# Patient Record
Sex: Male | Born: 1995 | Race: White | Hispanic: No | Marital: Single | Smoking: Current every day smoker
Health system: Southern US, Community
[De-identification: ages and names within clinical notes are randomized; demographics above are authoritative.]

---
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
## 2015-02-12 ENCOUNTER — Emergency Department
Admission: EM | Admit: 2015-02-12 | Discharge: 2015-02-12 | Disposition: A | Discharge status: Home / Self Care | Payer: Self-pay | Attending: Emergency Medicine | Admitting: Emergency Medicine

## 2015-02-12 ENCOUNTER — Encounter: Payer: Self-pay | Admitting: Emergency Medicine

## 2015-02-12 DIAGNOSIS — Z87891 Personal history of nicotine dependence: Secondary | ICD-10-CM | POA: Insufficient documentation

## 2015-02-12 DIAGNOSIS — Z79899 Other long term (current) drug therapy: Secondary | ICD-10-CM | POA: Insufficient documentation

## 2015-02-12 DIAGNOSIS — R300 Dysuria: Secondary | ICD-10-CM | POA: Insufficient documentation

## 2015-02-12 LAB — URINALYSIS COMPLETE WITH MICROSCOPIC (ARMC ONLY)
BACTERIA UA: NONE SEEN
BILIRUBIN URINE: NEGATIVE
Glucose, UA: NEGATIVE mg/dL
Hgb urine dipstick: NEGATIVE
KETONES UR: NEGATIVE mg/dL
Nitrite: NEGATIVE
Protein, ur: 30 mg/dL — AB
Specific Gravity, Urine: 1.029 (ref 1.005–1.030)
pH: 6 (ref 5.0–8.0)

## 2015-02-12 LAB — CHLAMYDIA/NGC RT PCR (ARMC ONLY)
Chlamydia Tr: DETECTED — AB
N gonorrhoeae: NOT DETECTED

## 2015-02-12 MED ORDER — LIDOCAINE HCL (PF) 1 % IJ SOLN
INTRAMUSCULAR | Status: AC
  Filled 2015-02-12: qty 5

## 2015-02-12 MED ORDER — PHENAZOPYRIDINE HCL 200 MG PO TABS: 200.0000 mg | ORAL_TABLET | Freq: Three times a day (TID) | ORAL | Status: DC | PRN

## 2015-02-12 MED ORDER — AZITHROMYCIN 250 MG PO TABS
1000.0000 mg | ORAL_TABLET | Freq: Once | ORAL | Status: AC
  Administered 2015-02-12: 1000 mg via ORAL

## 2015-02-12 MED ORDER — METRONIDAZOLE 500 MG PO TABS: 2000.0000 mg | ORAL_TABLET | Freq: Once | ORAL | Status: AC

## 2015-02-12 MED ORDER — CEFTRIAXONE SODIUM 250 MG IJ SOLR
250.0000 mg | Freq: Once | INTRAMUSCULAR | Status: AC
Start: 2015-02-12 — End: 2015-02-12
  Administered 2015-02-12: 250 mg via INTRAMUSCULAR

## 2015-02-12 MED ORDER — PHENAZOPYRIDINE HCL 200 MG PO TABS
ORAL_TABLET | ORAL | Status: AC
  Administered 2015-02-12: 200 mg via ORAL
  Filled 2015-02-12: qty 1

## 2015-02-12 MED ORDER — CEFTRIAXONE SODIUM 250 MG IJ SOLR
INTRAMUSCULAR | Status: AC
  Administered 2015-02-12: 250 mg via INTRAMUSCULAR
  Filled 2015-02-12: qty 250

## 2015-02-12 MED ORDER — PHENAZOPYRIDINE HCL 200 MG PO TABS
200.0000 mg | ORAL_TABLET | Freq: Once | ORAL | Status: AC
  Administered 2015-02-12: 200 mg via ORAL

## 2015-02-12 MED ORDER — IBUPROFEN 800 MG PO TABS: 800.0000 mg | ORAL_TABLET | Freq: Three times a day (TID) | ORAL | Status: DC | PRN

## 2015-02-12 MED ORDER — AZITHROMYCIN 250 MG PO TABS
ORAL_TABLET | ORAL | Status: AC
  Administered 2015-02-12: 1000 mg via ORAL
  Filled 2015-02-12: qty 4

## 2015-02-12 NOTE — ED Provider Notes (Signed)
[facility] Emergency Department Provider Note  ____________________________________________  Time seen: 1143   I have reviewed the triage vital signs and the nursing notes.   HISTORY  Chief Complaint Dysuria    HPI Jeffrey Kirk is a 19 y.o. male comes here today with a couple weeks of dysuria and states she was checked 3 weeks with health department and was told it was negative but he has continued to have symptoms states that his partner has not been checked with they have not been sexually active for about 2 weeks now and symptoms have persisted denies any testicular pain or tenderness nausea vomiting bleeding has noticed a white discharge and pain with urination. Today for further evaluation and treatment   No past medical history on file.  There are no active problems to display for this patient.   No past surgical history on file.  Current Outpatient Rx  Name  Route  Sig  Dispense  Refill  . ibuprofen (ADVIL,MOTRIN) 800 MG tablet   Oral   Take 1 tablet (800 mg total) by mouth every 8 (eight) hours as needed.   30 tablet   0   . metroNIDAZOLE (FLAGYL) 500 MG tablet   Oral   Take 4 tablets (2,000 mg total) by mouth once.   4 tablet   0   . phenazopyridine (PYRIDIUM) 200 MG tablet   Oral   Take 1 tablet (200 mg total) by mouth 3 (three) times daily as needed for pain.   10 tablet   0     Allergies Review of patient's allergies indicates no known allergies.  No family history on file.  Social History History  Substance Use Topics  . Smoking status: Former Games developer  . Smokeless tobacco: Not on file  . Alcohol Use: Yes    Review of Systems Constitutional: No fever/chills Eyes: No visual changes. ENT: No sore throat. Cardiovascular: Denies chest pain. Respiratory: Denies shortness of breath. Gastrointestinal: No abdominal pain.  No nausea, no vomiting.  No diarrhea.  No constipation. Genitourinary: dysuria. Musculoskeletal:  Negative for back pain. Skin: Negative for rash. Neurological: Negative for headaches, focal weakness or numbness.  10-point ROS otherwise negative.  ____________________________________________   PHYSICAL EXAM:  VITAL SIGNS: ED Triage Vitals  Enc Vitals Group     BP 02/12/15 1035 106/83 mmHg     Pulse Rate 02/12/15 1035 64     Resp 02/12/15 1035 18     Temp 02/12/15 1035 97.7 F (36.5 C)     Temp Source 02/12/15 1035 Oral     SpO2 02/12/15 1035 100 %     Weight 02/12/15 1035 135 lb (61.236 kg)     Height 02/12/15 1035  (1.778 m)     Head Cir --      Peak Flow --      Pain Score 02/12/15 1035 0     Pain Loc --      Pain Edu? --      Excl. in GC? --     Constitutional: Alert and oriented. Well appearing and in no acute distress. Eyes: Conjunctivae are normal. PERRL. EOMI. Head: Atraumatic. Nose: No congestion/rhinnorhea. Mouth/Throat: Mucous membranes are moist.  Oropharynx non-erythematous. Neck: No stridor.   Cardiovascular: Normal rate, regular rhythm. Grossly normal heart sounds.  Good peripheral circulation. Respiratory: Normal respiratory effort.  No retractions. Lungs CTAB. Gastrointestinal: Soft and nontender. No distention. No abdominal bruits. No CVA tenderness. Genitourinary: normal genitalia normal testicular exam there is some  discharge on the inside of his boxers but otherwise normal exam Musculoskeletal: No lower extremity tenderness nor edema.  No joint effusions. Neurologic:  Normal speech and language. No gross focal neurologic deficits are appreciated. Speech is normal. No gait instability. Skin:  Skin is warm, dry and intact. No rash noted. Psychiatric: Mood and affect are normal. Speech and behavior are normal.  ____________________________________________   LABS (all labs ordered are listed, but only abnormal results are displayed)  Labs Reviewed  URINALYSIS COMPLETEWITH MICROSCOPIC ([facility] ONLY) - Abnormal; Notable for the following:     Color, Urine YELLOW (*)    APPearance CLEAR (*)    Protein, ur 30 (*)    Leukocytes, UA TRACE (*)    Squamous Epithelial / LPF 0-5 (*)    All other components within normal limits  CHLAMYDIA/NGC RT PCR ([facility] ONLY)     PROCEDURES  Procedure(s) performed: None  Critical Care performed: No  ____________________________________________   INITIAL IMPRESSION / ASSESSMENT AND PLAN / ED COURSE  Pertinent labs & imaging results that were available during my care of the patient were reviewed by me and considered in my medical decision making (see chart for details).  Initial impression on this patient's urethritis dysuria based on the patient's presentation and exam the concern is that he has a form of sexually transmitted disease go ahead and cover him for gonorrhea Chlamydia and Trichomonas labs and cultures of been sent recommend that he follow up with the health department on the department he received Rocephin and azithromycin got a prescription for Flagyl Pyridium and Motrin return here for any acute concerns or worsening symptoms he is to have his partners checked and treated also ____________________________________________   FINAL CLINICAL IMPRESSION(S) / ED DIAGNOSES  Final diagnoses:  Dysuria      Lavon Bothwell Rosalyn Gess, PA-C 02/12/15 1300  Emily Filbert, MD 02/12/15 1428

## 2015-02-12 NOTE — ED Notes (Signed)
C/o burning upon urination x 1 week, denies any n,v, also c/o white discharge coming from penis

## 2015-02-12 NOTE — Discharge Instructions (Signed)

## 2015-02-12 NOTE — ED Notes (Signed)
Pt states dysuria, pt states white discharge, pt states he was checked several weeks ago for STD and was told he was negative, pt is sexually active

## 2015-03-11 ENCOUNTER — Emergency Department
Admission: EM | Admit: 2015-03-11 | Discharge: 2015-03-12 | Disposition: A | Discharge status: Home / Self Care | Payer: Self-pay | Attending: Emergency Medicine | Admitting: Emergency Medicine

## 2015-03-11 ENCOUNTER — Encounter: Payer: Self-pay | Admitting: Urgent Care

## 2015-03-11 DIAGNOSIS — Z720 Tobacco use: Secondary | ICD-10-CM | POA: Insufficient documentation

## 2015-03-11 DIAGNOSIS — A64 Unspecified sexually transmitted disease: Secondary | ICD-10-CM

## 2015-03-11 DIAGNOSIS — A749 Chlamydial infection, unspecified: Secondary | ICD-10-CM | POA: Insufficient documentation

## 2015-03-11 DIAGNOSIS — Z79899 Other long term (current) drug therapy: Secondary | ICD-10-CM | POA: Insufficient documentation

## 2015-03-11 LAB — URINALYSIS COMPLETE WITH MICROSCOPIC (ARMC ONLY)
Bilirubin Urine: NEGATIVE
Glucose, UA: NEGATIVE mg/dL
Hgb urine dipstick: NEGATIVE
Ketones, ur: NEGATIVE mg/dL
Leukocytes, UA: NEGATIVE
Nitrite: NEGATIVE
PROTEIN: 30 mg/dL — AB
RBC / HPF: NONE SEEN RBC/hpf (ref 0–5)
SPECIFIC GRAVITY, URINE: 1.026 (ref 1.005–1.030)
WBC UA: NONE SEEN WBC/hpf (ref 0–5)
pH: 6 (ref 5.0–8.0)

## 2015-03-11 NOTE — ED Notes (Signed)
Patient presents with c/o lower back pain and c/o dysuria for the last few days. Denies fever, gross hematuria, and penile discharge.

## 2015-03-12 MED ORDER — AZITHROMYCIN 1 G PO PACK
PACK | ORAL | Status: AC
  Administered 2015-03-12: 1 g via ORAL
  Filled 2015-03-12: qty 1

## 2015-03-12 MED ORDER — CEFTRIAXONE SODIUM 250 MG IJ SOLR
INTRAMUSCULAR | Status: AC
Start: 2015-03-12 — End: 2015-03-12
  Administered 2015-03-12: 250 mg via INTRAMUSCULAR
  Filled 2015-03-12: qty 250

## 2015-03-12 MED ORDER — AZITHROMYCIN 1 G PO PACK
1.0000 g | PACK | Freq: Once | ORAL | Status: AC
  Administered 2015-03-12: 1 g via ORAL

## 2015-03-12 MED ORDER — CEFTRIAXONE SODIUM 250 MG IJ SOLR
250.0000 mg | Freq: Once | INTRAMUSCULAR | Status: AC
Start: 2015-03-12 — End: 2015-03-12
  Administered 2015-03-12: 250 mg via INTRAMUSCULAR

## 2015-03-12 NOTE — ED Notes (Signed)
Pt reports dysuria for 2 weeks.  Denies penile discharge.  Pt reports right lower back pain  No n/v/d.

## 2015-03-12 NOTE — ED Provider Notes (Signed)
[facility] Emergency Department Provider Note  ____________________________________________  Time seen: 12:05 AM  I have reviewed the triage vital signs and the nursing notes.   HISTORY  Chief Complaint Dysuria      HPI Jeffrey Kirk is a 19 y.o. male presents with dysuria 2 weeks. Patient denies penile discharge at this time. Of note patient was seen on 02/12/2015 at [facility] ED for dysuria patient was given antibiotics at that time. Urine culture revealed chlamydia positive. Patient states that he was unaware of what he was being treated for and was not aware of the urine results. As such patient is indeed sexually active and has continued to be sexually active with the same partner who was never treated nor tested.     Past medical history Chlamydia  Past surgical history None    Current Outpatient Rx  Name  Route  Sig  Dispense  Refill  . ibuprofen (ADVIL,MOTRIN) 800 MG tablet   Oral   Take 1 tablet (800 mg total) by mouth every 8 (eight) hours as needed.   30 tablet   0   . phenazopyridine (PYRIDIUM) 200 MG tablet   Oral   Take 1 tablet (200 mg total) by mouth 3 (three) times daily as needed for pain.   10 tablet   0     Allergies Review of patient's allergies indicates no known allergies.  No family history on file.  Social History History  Substance Use Topics  . Smoking status: Current Every Day Smoker  . Smokeless tobacco: Not on file  . Alcohol Use: Yes    Review of Systems  Constitutional: Negative for fever. Eyes: Negative for visual changes. ENT: Negative for sore throat. Cardiovascular: Negative for chest pain. Respiratory: Negative for shortness of breath. Gastrointestinal: Negative for abdominal pain, vomiting and diarrhea. Genitourinary: Negative for dysuria. Musculoskeletal: Negative for back pain. Skin: Negative for rash. Neurological: Negative for headaches, focal weakness or numbness.   10-point ROS  otherwise negative.  ____________________________________________   PHYSICAL EXAM:  VITAL SIGNS: ED Triage Vitals  Enc Vitals Group     BP 03/11/15 2239 123/88 mmHg     Pulse Rate 03/11/15 2239 87     Resp 03/11/15 2239 18     Temp 03/11/15 2239 98.2 F (36.8 C)     Temp Source 03/11/15 2239 Oral     SpO2 03/11/15 2239 98 %     Weight 03/11/15 2239 135 lb (61.236 kg)     Height 03/11/15 2239  (1.778 m)     Head Cir --      Peak Flow --      Pain Score 03/11/15 2240 4     Pain Loc --      Pain Edu? --      Excl. in GC? --      Constitutional: Alert and oriented. Well appearing and in no distress. Eyes: Conjunctivae are normal. PERRL. Normal extraocular movements. ENT   Head: Normocephalic and atraumatic.   Nose: No congestion/rhinnorhea.   Mouth/Throat: Mucous membranes are moist.   Neck: No stridor. Cardiovascular: Normal rate, regular rhythm. Normal and symmetric distal pulses are present in all extremities. No murmurs, rubs, or gallops. Respiratory: Normal respiratory effort without tachypnea nor retractions. Breath sounds are clear and equal bilaterally. No wheezes/rales/rhonchi. Gastrointestinal: Soft and nontender. No distention. There is no CVA tenderness. Genitourinary: deferred Musculoskeletal: Nontender with normal range of motion in all extremities. No joint effusions.  No lower extremity tenderness nor  edema. Neurologic:  Normal speech and language. No gross focal neurologic deficits are appreciated. Speech is normal.  Skin:  Skin is warm, dry and intact. No rash noted. Psychiatric: Mood and affect are normal. Speech and behavior are normal. Patient exhibits appropriate insight and judgment.     INITIAL IMPRESSION / ASSESSMENT AND PLAN / ED COURSE  Pertinent labs & imaging results that were available during my care of the patient were reviewed by me and considered in my medical decision making (see chart for details). Strip physical exam  consistent with possible chlamydia such patient received azithromycin 1 g by mouth as well as ceftriaxone 250 mg intramuscular   ____________________________________________   FINAL CLINICAL IMPRESSION(S) / ED DIAGNOSES  Final diagnoses:  STD (male)  Chlamydia      Darci Current, MD 03/12/15 [id number]

## 2015-03-12 NOTE — Discharge Instructions (Signed)
Sexually Transmitted Disease °A sexually transmitted disease (STD) is a disease or infection that may be passed (transmitted) from person to person, usually during sexual activity. This may happen by way of saliva, semen, blood, vaginal mucus, or urine. Common STDs include:  °· Gonorrhea.   °· Chlamydia.   °· Syphilis.   °· HIV and AIDS.   °· Genital herpes.   °· Hepatitis B and C.   °· Trichomonas.   °· Human papillomavirus (HPV).   °· Pubic lice.   °· Scabies. °· Mites. °· Bacterial vaginosis. °WHAT ARE CAUSES OF STDs? °An STD may be caused by bacteria, a virus, or parasites. STDs are often transmitted during sexual activity if one person is infected. However, they may also be transmitted through nonsexual means. STDs may be transmitted after:  °· Sexual intercourse with an infected person.   °· Sharing sex toys with an infected person.   °· Sharing needles with an infected person or using unclean piercing or tattoo needles. °· Having intimate contact with the genitals, mouth, or rectal areas of an infected person.   °· Exposure to infected fluids during birth. °WHAT ARE THE SIGNS AND SYMPTOMS OF STDs? °Different STDs have different symptoms. Some people may not have any symptoms. If symptoms are present, they may include:  °· Painful or bloody urination.   °· Pain in the pelvis, abdomen, vagina, anus, throat, or eyes.   °· A skin rash, itching, or irritation. °· Growths, ulcerations, blisters, or sores in the genital and anal areas. °· Abnormal vaginal discharge with or without bad odor.   °· Penile discharge in men.   °· Fever.   °· Pain or bleeding during sexual intercourse.   °· Swollen glands in the groin area.   °· Yellow skin and eyes (jaundice). This is seen with hepatitis.   °· Swollen testicles. °· Infertility. °· Sores and blisters in the mouth. °HOW ARE STDs DIAGNOSED? °To make a diagnosis, your health care provider may:  °· Take a medical history.   °· Perform a physical exam.   °· Take a sample of  any discharge to examine. °· Swab the throat, cervix, opening to the penis, rectum, or vagina for testing. °· Test a sample of your first morning urine.   °· Perform blood tests.   °· Perform a Pap test, if this applies.   °· Perform a colposcopy.   °· Perform a laparoscopy.   °HOW ARE STDs TREATED? ° Treatment depends on the STD. Some STDs may be treated but not cured.  °· Chlamydia, gonorrhea, trichomonas, and syphilis can be cured with antibiotic medicine.   °· Genital herpes, hepatitis, and HIV can be treated, but not cured, with prescribed medicines. The medicines lessen symptoms.   °· Genital warts from HPV can be treated with medicine or by freezing, burning (electrocautery), or surgery. Warts may come back.   °· HPV cannot be cured with medicine or surgery. However, abnormal areas may be removed from the cervix, vagina, or vulva.   °· If your diagnosis is confirmed, your recent sexual partners need treatment. This is true even if they are symptom-free or have a negative culture or evaluation. They should not have sex until their health care providers say it is okay. °HOW CAN I REDUCE MY RISK OF GETTING AN STD? °Take these steps to reduce your risk of getting an STD: °· Use latex condoms, dental dams, and water-soluble lubricants during sexual activity. Do not use petroleum jelly or oils. °· Avoid having multiple sex partners. °· Do not have sex with someone who has other sex partners. °· Do not have sex with anyone you do not know or who is at   high risk for an STD.  Avoid risky sex practices that can break your skin.  Do not have sex if you have open sores on your mouth or skin.  Avoid drinking too much alcohol or taking illegal drugs. Alcohol and drugs can affect your judgment and put you in a vulnerable position.  Avoid engaging in oral and anal sex acts.  Get vaccinated for HPV and hepatitis. If you have not received these vaccines in the past, talk to your health care provider about whether one  or both might be right for you.   If you are at risk of being infected with HIV, it is recommended that you take a prescription medicine daily to prevent HIV infection. This is called pre-exposure prophylaxis (PrEP). You are considered at risk if:  You are a man who has sex with other men (MSM).  You are a heterosexual man or woman and are sexually active with more than one partner.  You take drugs by injection.  You are sexually active with a partner who has HIV.  Talk with your health care provider about whether you are at high risk of being infected with HIV. If you choose to begin PrEP, you should first be tested for HIV. You should then be tested every 3 months for as long as you are taking PrEP.  WHAT SHOULD I DO IF I THINK I HAVE AN STD?  See your health care provider.   Tell your sexual partner(s). They should be tested and treated for any STDs.  Do not have sex until your health care provider says it is okay. WHEN SHOULD I GET IMMEDIATE MEDICAL CARE? Contact your health care provider right away if:   You have severe abdominal pain.  You are a man and notice swelling or pain in your testicles.  You are a woman and notice swelling or pain in your vagina. Document Released: 10/29/2002 Document Revised: 08/13/2013 Document Reviewed: 02/26/2013 [facility] Patient Information 2015 [place], [place]. This information is not intended to replace advice given to you by your health care provider. Make sure you discuss any questions you have with your health care provider.  Chlamydia Chlamydia is an infection. It is spread through sexual contact. Chlamydia can be in different areas of the body. These areas include the urethra, throat, or rectum. It is important to treat chlamydia as soon as possible. It can damage other organs.  CAUSES  Chlamydia is caused by bacteria. It is a sexually transmitted disease. This means that it is passed from an infected partner during intimate contact.  This contact could be with the genitals, mouth, or rectal area.  SIGNS AND SYMPTOMS  There may not be any symptoms. This is often the case early in the infection. If there are symptoms, they are usually mild and may only be noticeable in the morning. Symptoms you may notice include:   Burning with urination.  Pain or swelling in the testicles.  Watery mucus-like discharge from the penis.  Long-standing (chronic) pelvic pain after frequent infections.  Pain, swelling, or itching around the anus.  A sore throat.  Itching, burning, or redness in the eyes, or discharge from the eyes. DIAGNOSIS  To diagnose this infection, your health care provider will do a pelvic exam. A sample of urine or a swab from the rectum may be taken for testing.  TREATMENT  Chlamydia is treated with antibiotic medicines.  HOME CARE INSTRUCTIONS  Take your antibiotic medicine as directed by your health care provider. [employer]  the antibiotic even if you start to feel better. Incomplete treatment will put you at risk for not being able to have children (sterility).   Take medicines only as directed by your health care provider.   Rest.   Inform any sexual partners about your infection. Even if they are symptom free or have a negative culture or evaluation, they should be treated for the condition.   Do not have sex (intercourse) until treatment is completed and your health care provider says it is okay.   Keep all follow-up visits as directed by your health care provider.   Not all test results are available during your visit. If your test results are not back during the visit, make an appointment with your health care provider to find out the results. Do not assume everything is normal if you have not heard from your health care provider or the medical facility. It is your responsibility to get your test results. SEEK MEDICAL CARE IF:  You develop new joint pain.  You have a fever. SEEK IMMEDIATE  MEDICAL CARE IF:   Your pain increases.   You have abnormal discharge.   You have pain during intercourse. MAKE SURE YOU:   Understand these instructions.  Will watch your condition.  Will get help right away if you are not doing well or get worse. Document Released: 08/08/2005 Document Revised: 12/23/2013 Document Reviewed: 02/14/2013 [facility] Patient Information 2015 [place], [place]. This information is not intended to replace advice given to you by your health care provider. Make sure you discuss any questions you have with your health care provider.

## 2015-04-03 ENCOUNTER — Emergency Department
Admission: EM | Admit: 2015-04-03 | Discharge: 2015-04-03 | Discharge status: Against Medical Advice | Payer: Self-pay | Attending: Emergency Medicine | Admitting: Emergency Medicine

## 2015-04-03 DIAGNOSIS — Z720 Tobacco use: Secondary | ICD-10-CM | POA: Insufficient documentation

## 2015-04-03 DIAGNOSIS — R1030 Lower abdominal pain, unspecified: Secondary | ICD-10-CM | POA: Insufficient documentation

## 2015-07-06 ENCOUNTER — Emergency Department: Payer: Self-pay

## 2015-07-06 ENCOUNTER — Encounter: Payer: Self-pay | Admitting: Emergency Medicine

## 2015-07-06 ENCOUNTER — Emergency Department
Admission: EM | Admit: 2015-07-06 | Discharge: 2015-07-06 | Disposition: A | Discharge status: Home / Self Care | Payer: Self-pay | Attending: Emergency Medicine | Admitting: Emergency Medicine

## 2015-07-06 DIAGNOSIS — Y9289 Other specified places as the place of occurrence of the external cause: Secondary | ICD-10-CM | POA: Insufficient documentation

## 2015-07-06 DIAGNOSIS — S60414A Abrasion of right ring finger, initial encounter: Secondary | ICD-10-CM | POA: Insufficient documentation

## 2015-07-06 DIAGNOSIS — W2201XA Walked into wall, initial encounter: Secondary | ICD-10-CM | POA: Insufficient documentation

## 2015-07-06 DIAGNOSIS — S60221A Contusion of right hand, initial encounter: Secondary | ICD-10-CM | POA: Insufficient documentation

## 2015-07-06 DIAGNOSIS — Y998 Other external cause status: Secondary | ICD-10-CM | POA: Insufficient documentation

## 2015-07-06 DIAGNOSIS — S60416A Abrasion of right little finger, initial encounter: Secondary | ICD-10-CM | POA: Insufficient documentation

## 2015-07-06 DIAGNOSIS — S60412A Abrasion of right middle finger, initial encounter: Secondary | ICD-10-CM | POA: Insufficient documentation

## 2015-07-06 DIAGNOSIS — Y9389 Activity, other specified: Secondary | ICD-10-CM | POA: Insufficient documentation

## 2015-07-06 DIAGNOSIS — F17200 Nicotine dependence, unspecified, uncomplicated: Secondary | ICD-10-CM | POA: Insufficient documentation

## 2015-07-06 MED ORDER — NAPROXEN 500 MG PO TABS: 500.0000 mg | ORAL_TABLET | Freq: Two times a day (BID) | ORAL | Status: DC
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
## 2015-07-06 NOTE — Discharge Instructions (Signed)
Hand Contusion  A hand contusion is a deep bruise on your hand area. Contusions are the result of an injury that caused bleeding under the skin. The contusion may turn blue, purple, or yellow. Minor injuries will give you a painless contusion, but more severe contusions may stay painful and swollen for a few weeks.  CAUSES   A contusion is usually caused by a blow, trauma, or direct force to an area of the body.  SYMPTOMS    Swelling and redness of the injured area.   Discoloration of the injured area.   Tenderness and soreness of the injured area.   Pain.  DIAGNOSIS   The diagnosis can be made by taking a history and performing a physical exam. An X-ray, CT scan, or MRI may be needed to determine if there were any associated injuries, such as broken bones (fractures).  TREATMENT   Often, the best treatment for a hand contusion is resting, elevating, icing, and applying cold compresses to the injured area. Over-the-counter medicines may also be recommended for pain control.  HOME CARE INSTRUCTIONS    Put ice on the injured area.    Put ice in a plastic bag.    Place a towel between your skin and the bag.    Leave the ice on for 15-20 minutes, 03-04 times a day.   Only take over-the-counter or prescription medicines as directed by your caregiver. Your caregiver may recommend avoiding anti-inflammatory medicines (aspirin, ibuprofen, and naproxen) for 48 hours because these medicines may increase bruising.   If told, use an elastic wrap as directed. This can help reduce swelling. You may remove the wrap for sleeping, showering, and bathing. If your fingers become numb, cold, or blue, take the wrap off and reapply it more loosely.   Elevate your hand with pillows to reduce swelling.   Avoid overusing your hand if it is painful.  SEEK IMMEDIATE MEDICAL CARE IF:    You have increased redness, swelling, or pain in your hand.   Your swelling or pain is not relieved with medicines.   You have loss of feeling in  your hand or are unable to move your fingers.   Your hand turns cold or blue.   You have pain when you move your fingers.   Your hand becomes warm to the touch.   Your contusion does not improve in 2 days.  MAKE SURE YOU:    Understand these instructions.   Will watch your condition.   Will get help right away if you are not doing well or get worse.     This information is not intended to replace advice given to you by your health care provider. Make sure you discuss any questions you have with your health care provider.     Document Released: 01/28/2002 Document Revised: 05/02/2012 Document Reviewed: 01/30/2012  Elsevier Interactive Patient Education 2016 [employer]

## 2016-02-07 ENCOUNTER — Emergency Department
Admission: EM | Admit: 2016-02-07 | Discharge: 2016-02-07 | Disposition: A | Discharge status: Home / Self Care | Payer: Self-pay | Attending: Emergency Medicine | Admitting: Emergency Medicine

## 2016-02-07 DIAGNOSIS — F17200 Nicotine dependence, unspecified, uncomplicated: Secondary | ICD-10-CM | POA: Insufficient documentation

## 2016-02-07 DIAGNOSIS — N342 Other urethritis: Secondary | ICD-10-CM | POA: Insufficient documentation

## 2016-02-07 LAB — URINALYSIS COMPLETE WITH MICROSCOPIC (ARMC ONLY)
Bilirubin Urine: NEGATIVE
GLUCOSE, UA: NEGATIVE mg/dL
Hgb urine dipstick: NEGATIVE
Ketones, ur: NEGATIVE mg/dL
LEUKOCYTES UA: NEGATIVE
Nitrite: NEGATIVE
PROTEIN: NEGATIVE mg/dL
SPECIFIC GRAVITY, URINE: 1.028 (ref 1.005–1.030)
pH: 5 (ref 5.0–8.0)

## 2016-02-07 LAB — CHLAMYDIA/NGC RT PCR (ARMC ONLY)
CHLAMYDIA TR: NOT DETECTED
N GONORRHOEAE: NOT DETECTED

## 2016-02-07 MED ORDER — LIDOCAINE HCL (PF) 1 % IJ SOLN
INTRAMUSCULAR | Status: AC
  Filled 2016-02-07: qty 5

## 2016-02-07 MED ORDER — CEFTRIAXONE SODIUM 250 MG IJ SOLR: 250.0000 mg | Freq: Once | INTRAMUSCULAR | Status: DC

## 2016-02-07 MED ORDER — CEFTRIAXONE SODIUM 250 MG IJ SOLR
INTRAMUSCULAR | Status: AC
  Filled 2016-02-07: qty 250

## 2016-02-07 MED ORDER — DOXYCYCLINE HYCLATE 100 MG PO TABS: 100.0000 mg | ORAL_TABLET | Freq: Two times a day (BID) | ORAL | Status: AC

## 2016-02-07 NOTE — ED Provider Notes (Signed)
[facility] Emergency Department Provider Note  ____________________________________________  Time seen: Approximately 7:52 AM  I have reviewed the triage vital signs and the nursing notes.   HISTORY  Chief Complaint Dysuria    HPI Jeffrey Kirk is a 20 y.o. male , NAD, presents to the emergency department with a several hour history of dysuria. States urinary pain began last night. Also notes he saw something in his urine last night that "looked like rocks". Denies any hematuria, urethral discharge, testicular pain, abdominal pain, nausea, vomiting, back pain. Has not had any fevers, chills, body aches. Did take Tylenol last night for pain but has not taken anything today. Denies any trauma or injury to the genital area.   No past medical history on file.  There are no active problems to display for this patient.   No past surgical history on file.  Current Outpatient Rx  Name  Route  Sig  Dispense  Refill  . doxycycline (VIBRA-TABS) 100 MG tablet   Oral   Take 1 tablet (100 mg total) by mouth 2 (two) times daily.   14 tablet   0     Allergies Review of patient's allergies indicates no known allergies.  No family history on file.  Social History Social History  Substance Use Topics  . Smoking status: Current Every Day Smoker  . Smokeless tobacco: Not on file  . Alcohol Use: Yes     Review of Systems  Constitutional: No fever/chills, fatigue Eyes: No visual changes.  ENT: No sore throat. Cardiovascular: No chest pain. Respiratory:  No shortness of breath. No wheezing.  Gastrointestinal: No abdominal pain.  No nausea, vomiting.  No diarrhea.  No constipation. Genitourinary: Positive for dysuria and unknown objects in the urine. No hematuria. No urinary hesitancy, urgency or increased frequency. Musculoskeletal: Negative for back pain.  Skin: Negative for rash, skin source. Neurological: Negative for headaches, focal weakness or  numbness. No tingling, saddle paresthesias 10-point ROS otherwise negative.  ____________________________________________   PHYSICAL EXAM:  VITAL SIGNS: ED Triage Vitals  Enc Vitals Group     BP 02/07/16 0746 125/84 mmHg     Pulse Rate 02/07/16 0746 61     Resp 02/07/16 0746 16     Temp 02/07/16 0746 97.5 F (36.4 C)     Temp Source 02/07/16 0746 Oral     SpO2 02/07/16 0746 99 %     Weight 02/07/16 0746 140 lb (63.504 kg)     Height 02/07/16 0746  (1.778 m)     Head Cir --      Peak Flow --      Pain Score 02/07/16 0746 6     Pain Loc --      Pain Edu? --      Excl. in GC? --      Constitutional: Alert and oriented. Well appearing and in no acute distress. Eyes: Conjunctivae are normal.  Head: Atraumatic. Neck: Supple with full range of motion Hematological/Lymphatic/Immunilogical: No cervical lymphadenopathy. Cardiovascular: Normal rate, regular rhythm. Normal S1 and S2.  Good peripheral circulation. Respiratory: Normal respiratory effort without tachypnea or retractions. Lungs CTABWith breath sounds noted in all lung fields. Gastrointestinal: Soft and nontender without distention or guarding in all quadrants. No CVA tenderness. Musculoskeletal: No lower extremity tenderness nor edema.  No joint effusions. Neurologic:  Normal speech and language. No gross focal neurologic deficits are appreciated.  Skin:  Skin is warm, dry and intact. No rash noted. Psychiatric: Mood and affect  are normal. Speech and behavior are normal. Patient exhibits appropriate insight and judgement.   ____________________________________________   LABS (all labs ordered are listed, but only abnormal results are displayed)  Labs Reviewed  URINALYSIS COMPLETEWITH MICROSCOPIC ([facility] ONLY) - Abnormal; Notable for the following:    Color, Urine YELLOW (*)    APPearance HAZY (*)    Bacteria, UA RARE (*)    Squamous Epithelial / LPF 0-5 (*)    All other components within normal limits   CHLAMYDIA/NGC RT PCR ([facility] ONLY)  URINE CULTURE   ____________________________________________  EKG  None ____________________________________________  RADIOLOGY  None ____________________________________________    PROCEDURES  Procedure(s) performed: None    Medications  cefTRIAXone (ROCEPHIN) injection 250 mg (not administered)  lidocaine (PF) (XYLOCAINE) 1 % injection (not administered)   At the time of discharge patient declined Rocephin injection stating "I don't have Chlamydia". His significant other is in the room and is pregnant. She has had multiple tests returning negative for chlamydia or gonorrhea through her OB/GYN. Patient states he is not sexually active with any other partners and would rather wait for his cultures to return for treatment. He does state that he will accept the doxycycline prescription but will not start the medication until culture results are received.  ____________________________________________   INITIAL IMPRESSION / ASSESSMENT AND PLAN / ED COURSE  Pertinent lab results that were available during my care of the patient were reviewed by me and considered in my medical decision making (see chart for details).  Patient's diagnosis is consistent with urethritis. Patient will be discharged home with prescriptions for doxycycline to take as directed. Patient is to [facility] up with [facility] for recheck and repeat gonorrhea and chlamydia cultures in 2 weeks if a positive culture is noted today. [facility] if symptoms persist over the next few days. Patient is given ED precautions to return to the ED for any worsening or new symptoms.    ____________________________________________  FINAL CLINICAL IMPRESSION(S) / ED DIAGNOSES  Final diagnoses:  Urethritis      NEW MEDICATIONS STARTED DURING THIS  VISIT:  New Prescriptions   DOXYCYCLINE (VIBRA-TABS) 100 MG TABLET    Take 1 tablet (100 mg total) by mouth 2 (two) times daily.         Hope PigeonJami L Jaiel Saraceno, PA-C 02/07/16 0919  Sharyn CreamerMark Quale, MD 02/07/16 [phone]

## 2016-02-07 NOTE — ED Notes (Signed)
States he developed some dysuria last pm  And noticed some sediment in urine  Denies any blood in urine and states pain does not radiates

## 2016-02-07 NOTE — Discharge Instructions (Signed)
Urethritis, Adult Urethritis is an inflammation of the tube through which urine exits your bladder (urethra).  CAUSES Urethritis is often caused by an infection in your urethra. The infection can be viral, like herpes. The infection can also be bacterial, like gonorrhea. RISK FACTORS Risk factors of urethritis include:  Having sex without using a condom.  Having multiple sexual partners.  Having poor hygiene. SIGNS AND SYMPTOMS Symptoms of urethritis are less noticeable in women than in men. These symptoms include:  Burning feeling when you urinate (dysuria).  Discharge from your urethra.  Blood in your urine (hematuria).  Urinating more than usual. DIAGNOSIS  To confirm a diagnosis of urethritis, your health care provider will do the following:  Ask about your sexual history.  Perform a physical exam.  Have you provide a sample of your urine for lab testing.  Use a cotton swab to gently collect a sample from your urethra for lab testing. TREATMENT  It is important to treat urethritis. Depending on the cause, untreated urethritis may lead to serious genital infections and possibly infertility. Urethritis caused by a bacterial infection is treated with antibiotic medicine. All sexual partners must be treated.  HOME CARE INSTRUCTIONS  Do not have sex until the test results are known and treatment is completed, even if your symptoms go away before you finish treatment.  If you were prescribed an antibiotic, finish it all even if you start to feel better. SEEK MEDICAL CARE IF:   Your symptoms are not improved in 3 days.  Your symptoms are getting worse.  You develop abdominal pain or pelvic pain (in women).  You develop joint pain.  You have a fever. SEEK IMMEDIATE MEDICAL CARE IF:   You have severe pain in the belly, back, or side.  You have repeated vomiting. MAKE SURE YOU:  Understand these instructions.  Will watch your condition.  Will get help right away  if you are not doing well or get worse.   This information is not intended to replace advice given to you by your health care provider. Make sure you discuss any questions you have with your health care provider.   Document Released: 02/01/2001 Document Revised: 12/23/2014 Document Reviewed: 04/08/2013 Elsevier Interactive Patient Education 2016 Elsevier Inc.   Chlamydia, Male  Chlamydia is an infection. It is spread through sexual contact. Chlamydia can be in different areas of the body. These areas include the urethra, throat, or rectum. It is important to treat chlamydia as soon as possible. It can damage other organs.  CAUSES  Chlamydia is caused by bacteria. It is a sexually transmitted disease. This means that it is passed from an infected partner during intimate contact. This contact could be with the genitals, mouth, or rectal area.  SIGNS AND SYMPTOMS  There may not be any symptoms. This is often the case early in the infection. If there are symptoms, they are usually mild and may only be noticeable in the morning. Symptoms you may notice include:  Burning with urination.  Pain or swelling in the testicles.  Watery mucus-like discharge from the penis.  Long-standing (chronic) pelvic pain after frequent infections.  Pain, swelling, or itching around the anus.  A sore throat.  Itching, burning, or redness in the eyes, or discharge from the eyes. DIAGNOSIS  To diagnose this infection, your health care provider will do a pelvic exam. A sample of urine or a swab from the rectum may be taken for testing.  TREATMENT  Chlamydia is treated with  antibiotic medicines. Your health care provider may test you for infection again 3 months after treatment.  HOME CARE INSTRUCTIONS  Take your antibiotic medicine as directed by your health care provider. Finish the antibiotic even if you start to feel better. Incomplete treatment will put you at risk for not being able to have children (sterility).    Take medicines only as directed by your health care provider.  Rest.  Inform any sexual partners about your infection. Even if they are symptom free or have a negative culture or evaluation, they should be treated for the condition.  Do not have sex (intercourse) until treatment is completed and your health care provider says it is okay.  Keep all follow-up visits as directed by your health care provider.  Not all test results are available during your visit. If your test results are not back during the visit, make an appointment with your health care provider to find out the results. Do not assume everything is normal if you have not heard from your health care provider or the medical facility. It is your responsibility to get your test results. SEEK MEDICAL CARE IF:  You develop new joint pain.  You have a fever. SEEK IMMEDIATE MEDICAL CARE IF:  Your pain increases.  You have abnormal discharge.  You have pain during intercourse. MAKE SURE YOU:  Understand these instructions.  Will watch your condition.  Will get help right away if you are not doing well or get worse. This information is not intended to replace advice given to you by your health care provider. Make sure you discuss any questions you have with your health care provider.  Document Released: 08/08/2005 Document Revised: 08/29/2014 Document Reviewed: 02/14/2013  Elsevier Interactive Patient Education [employer]

## 2016-02-07 NOTE — ED Notes (Signed)
Pt states that he started having some with urination yesterday and last night he saw something that looks like little rocks, no hx of kidney stones, states now when he attempts to void its very painful

## 2016-02-08 LAB — URINE CULTURE
CULTURE: NO GROWTH
SPECIAL REQUESTS: NORMAL

## 2016-08-08 ENCOUNTER — Encounter: Payer: Self-pay | Admitting: Emergency Medicine

## 2016-08-08 ENCOUNTER — Emergency Department: Payer: Self-pay

## 2016-08-08 ENCOUNTER — Emergency Department
Admission: EM | Admit: 2016-08-08 | Discharge: 2016-08-08 | Disposition: A | Discharge status: Home / Self Care | Payer: Self-pay | Attending: Emergency Medicine | Admitting: Emergency Medicine

## 2016-08-08 DIAGNOSIS — X500XXA Overexertion from strenuous movement or load, initial encounter: Secondary | ICD-10-CM | POA: Insufficient documentation

## 2016-08-08 DIAGNOSIS — F17200 Nicotine dependence, unspecified, uncomplicated: Secondary | ICD-10-CM | POA: Insufficient documentation

## 2016-08-08 DIAGNOSIS — M62830 Muscle spasm of back: Secondary | ICD-10-CM | POA: Insufficient documentation

## 2016-08-08 DIAGNOSIS — Y9389 Activity, other specified: Secondary | ICD-10-CM | POA: Insufficient documentation

## 2016-08-08 DIAGNOSIS — Y990 Civilian activity done for income or pay: Secondary | ICD-10-CM | POA: Insufficient documentation

## 2016-08-08 DIAGNOSIS — Y929 Unspecified place or not applicable: Secondary | ICD-10-CM | POA: Insufficient documentation

## 2016-08-08 MED ORDER — CYCLOBENZAPRINE HCL 5 MG PO TABS: 5.0000 mg | ORAL_TABLET | Freq: Three times a day (TID) | ORAL | 0 refills | Status: AC | PRN

## 2016-08-08 MED ORDER — MELOXICAM 15 MG PO TABS: 15.0000 mg | ORAL_TABLET | Freq: Every day | ORAL | 0 refills | Status: AC

## 2016-08-08 NOTE — ED Provider Notes (Signed)
[facility] Emergency Department Provider Note  ____________________________________________  Time seen: Approximately 1:02 PM  I have reviewed the triage vital signs and the nursing notes.   HISTORY  Chief Complaint Back Pain    HPI Jeffrey Kirk is a 20 y.o. male that presents to the emergency department with 3 hours of mid back pain. Patient was at work and was "lifting the heavy use piece of equipment he's ever lifted" when he began to experience sharp pain. Pain is on right side of back next to scapula. Patient describes the pain as stabbing. Patient has had more difficulty breathing since incident. Patient has never had back pain previously. Patient denies additional trauma.   History reviewed. No pertinent past medical history.  There are no active problems to display for this patient.   History reviewed. No pertinent surgical history.  Prior to Admission medications   Medication Sig Start Date End Date Taking? Authorizing Provider  cyclobenzaprine (FLEXERIL) 5 MG tablet Take 1 tablet (5 mg total) by mouth 3 (three) times daily as needed for muscle spasms. 08/08/16 08/15/16  Enid DerryAshley Jeanie Mccard, PA-C  meloxicam (MOBIC) 15 MG tablet Take 1 tablet (15 mg total) by mouth daily. 08/08/16 08/18/16  Enid DerryAshley Wylene Weissman, PA-C    Allergies Patient has no known allergies.  No family history on file.  Social History Social History  Substance Use Topics  . Smoking status: Current Every Day Smoker  . Smokeless tobacco: Never Used  . Alcohol use Yes     Review of Systems  Constitutional: No fever/chills Cardiovascular: No chest pain. Respiratory: No cough. Gastrointestinal: No abdominal pain.  No nausea, no vomiting.  Musculoskeletal: Negative for musculoskeletal pain. Skin: Negative for rash, abrasions, lacerations, ecchymosis. Neurological: Negative for headaches, numbness or tingling   ____________________________________________   PHYSICAL  EXAM:  VITAL SIGNS: ED Triage Vitals  Enc Vitals Group     BP 08/08/16 1118 123/70     Pulse Rate 08/08/16 1118 81     Resp 08/08/16 1118 16     Temp 08/08/16 1118 97.9 F (36.6 C)     Temp Source 08/08/16 1118 Oral     SpO2 08/08/16 1118 100 %     Weight 08/08/16 1113 130 lb (59 kg)     Height 08/08/16 1113 5\' 10"  (1.778 m)     Head Circumference --      Peak Flow --      Pain Score 08/08/16 1114 6     Pain Loc --      Pain Edu? --      Excl. in [place]? --      Constitutional: Alert and oriented. Well appearing and in no acute distress. Eyes: Conjunctivae are normal. PERRL. EOMI. Head: Atraumatic. ENT:      Ears:      Nose: No congestion/rhinnorhea.      Mouth/Throat: Mucous membranes are moist.  Neck: No stridor.  No cervical spine tenderness to palpation. Cardiovascular: Normal rate, regular rhythm. Normal S1 and S2.  Good peripheral circulation. Respiratory: Normal respiratory effort without tachypnea or retractions. Lungs CTAB. Good air entry to the bases with no decreased or absent breath sounds. Musculoskeletal: Full range of motion to all extremities. No gross deformities appreciated.Tenderness to palpation between thoracic spine and right scapula. No tenderness over cervical, thoracic, or lumbar spine  Neurologic:  Normal speech and language. No gross focal neurologic deficits are appreciated.  Skin:  Skin is warm, dry and intact. No rash noted. Psychiatric: Mood and affect  are normal. Speech and behavior are normal. Patient exhibits appropriate insight and judgement.   ____________________________________________   LABS (all labs ordered are listed, but only abnormal results are displayed)  Labs Reviewed - No data to display ____________________________________________  EKG   ____________________________________________  RADIOLOGY Lexine BatonI, Phyillis Dascoli, personally viewed and evaluated these images (plain radiographs) as part of my medical decision making, as well  as reviewing the written report by the radiologist.  Dg Chest 2 View  Result Date: 08/08/2016 CLINICAL DATA:  20 year old male complaining of mid back pain after lifting heavy objects today. EXAM: CHEST  2 VIEW COMPARISON:  No priors. FINDINGS: Lung volumes are normal. No consolidative airspace disease. No pleural effusions. No pneumothorax. No pulmonary nodule or mass noted. Pulmonary vasculature and the cardiomediastinal silhouette are within normal limits. IMPRESSION: No radiographic evidence of acute cardiopulmonary disease. Electronically Signed   By: Trudie Reedaniel  Entrikin M.D.   On: 08/08/2016 12:51    ____________________________________________    PROCEDURES  Procedure(s) performed:    Procedures    Medications - No data to display   ____________________________________________   INITIAL IMPRESSION / ASSESSMENT AND PLAN / ED COURSE  Pertinent labs & imaging results that were available during my care of the patient were reviewed by me and considered in my medical decision making (see chart for details).  Review of the [place] CSRS was performed in accordance of the [facility] prior to dispensing any controlled drugs.  Clinical Course     Patient's diagnosis is consistent with muscle spasm. Patient will be discharged home with prescriptions for Meloxicam and cyclobenzaprine. Patient is to follow up with PCP as directed. Patient is given ED precautions to return to the ED for any worsening or new symptoms.     ____________________________________________  FINAL CLINICAL IMPRESSION(S) / ED DIAGNOSES  Final diagnoses:  Muscle spasm of back      NEW MEDICATIONS STARTED DURING THIS VISIT:  New Prescriptions   CYCLOBENZAPRINE (FLEXERIL) 5 MG TABLET    Take 1 tablet (5 mg total) by mouth 3 (three) times daily as needed for muscle spasms.   MELOXICAM (MOBIC) 15 MG TABLET    Take 1 tablet (15 mg total) by mouth daily.        This chart was dictated using voice recognition  software/Dragon. Despite best efforts to proofread, errors can occur which can change the meaning. Any change was purely unintentional.    Enid DerryAshley Patryce Depriest, PA-C 08/08/16 1314    Minna AntisKevin Paduchowski, MD 08/08/16 1446

## 2016-08-08 NOTE — ED Triage Notes (Signed)
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

## 2019-02-27 ENCOUNTER — Other Ambulatory Visit: Payer: Self-pay

## 2019-02-27 ENCOUNTER — Encounter: Payer: Self-pay | Admitting: *Deleted

## 2019-02-27 DIAGNOSIS — F17200 Nicotine dependence, unspecified, uncomplicated: Secondary | ICD-10-CM | POA: Insufficient documentation

## 2019-02-27 DIAGNOSIS — F15129 Other stimulant abuse with intoxication, unspecified: Secondary | ICD-10-CM | POA: Insufficient documentation

## 2019-02-27 LAB — URINALYSIS, COMPLETE (UACMP) WITH MICROSCOPIC
Bacteria, UA: NONE SEEN
Bilirubin Urine: NEGATIVE
Glucose, UA: NEGATIVE mg/dL
Hgb urine dipstick: NEGATIVE
Ketones, ur: 5 mg/dL — AB
Leukocytes,Ua: NEGATIVE
Nitrite: NEGATIVE
Protein, ur: 30 mg/dL — AB
Specific Gravity, Urine: 1.031 — ABNORMAL HIGH (ref 1.005–1.030)
Squamous Epithelial / LPF: NONE SEEN (ref 0–5)
pH: 5 (ref 5.0–8.0)

## 2019-02-27 LAB — URINE DRUG SCREEN, QUALITATIVE (ARMC ONLY)
Amphetamines, Ur Screen: POSITIVE — AB
Barbiturates, Ur Screen: NOT DETECTED
Benzodiazepine, Ur Scrn: NOT DETECTED
Cannabinoid 50 Ng, Ur ~~LOC~~: NOT DETECTED
Cocaine Metabolite,Ur ~~LOC~~: NOT DETECTED
MDMA (Ecstasy)Ur Screen: NOT DETECTED
Methadone Scn, Ur: NOT DETECTED
Opiate, Ur Screen: NOT DETECTED
Phencyclidine (PCP) Ur S: NOT DETECTED
Tricyclic, Ur Screen: NOT DETECTED

## 2019-02-27 LAB — COMPREHENSIVE METABOLIC PANEL
ALT: 13 U/L (ref 0–44)
AST: 14 U/L — ABNORMAL LOW (ref 15–41)
Albumin: 4.3 g/dL (ref 3.5–5.0)
Alkaline Phosphatase: 69 U/L (ref 38–126)
Anion gap: 8 (ref 5–15)
BUN: 9 mg/dL (ref 6–20)
CO2: 29 mmol/L (ref 22–32)
Calcium: 9.2 mg/dL (ref 8.9–10.3)
Chloride: 101 mmol/L (ref 98–111)
Creatinine, Ser: 0.84 mg/dL (ref 0.61–1.24)
GFR calc Af Amer: >60 mL/min (ref >60)
GFR calc non Af Amer: >60 mL/min (ref >60)
Glucose, Bld: 117 mg/dL — ABNORMAL HIGH (ref 70–99)
Potassium: 3.7 mmol/L (ref 3.5–5.1)
Sodium: 138 mmol/L (ref 135–145)
Total Bilirubin: 0.4 mg/dL (ref 0.3–1.2)
Total Protein: 7.2 g/dL (ref 6.5–8.1)

## 2019-02-27 LAB — CBC WITH DIFFERENTIAL/PLATELET
Abs Immature Granulocytes: 0.03 10*3/uL (ref 0.00–0.07)
Basophils Absolute: 0.1 10*3/uL (ref 0.0–0.1)
Basophils Relative: 1 %
Eosinophils Absolute: 0.4 10*3/uL (ref 0.0–0.5)
Eosinophils Relative: 3 %
HCT: 44.2 % (ref 39.0–52.0)
Hemoglobin: 15.5 g/dL (ref 13.0–17.0)
Immature Granulocytes: 0 %
Lymphocytes Relative: 31 %
Lymphs Abs: 4.6 10*3/uL — ABNORMAL HIGH (ref 0.7–4.0)
MCH: 31.3 pg (ref 26.0–34.0)
MCHC: 35.1 g/dL (ref 30.0–36.0)
MCV: 89.3 fL (ref 80.0–100.0)
Monocytes Absolute: 1.2 10*3/uL — ABNORMAL HIGH (ref 0.1–1.0)
Monocytes Relative: 8 %
Neutro Abs: 8.7 10*3/uL — ABNORMAL HIGH (ref 1.7–7.7)
Neutrophils Relative %: 57 %
Platelets: 356 10*3/uL (ref 150–400)
RBC: 4.95 MIL/uL (ref 4.22–5.81)
RDW: 12.2 % (ref 11.5–15.5)
WBC: 15 10*3/uL — ABNORMAL HIGH (ref 4.0–10.5)
nRBC: 0 % (ref 0.0–0.2)

## 2019-02-27 LAB — ETHANOL: Alcohol, Ethyl (B): <10 mg/dL (ref <10)
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
## 2019-02-28 ENCOUNTER — Emergency Department
Admission: EM | Admit: 2019-02-28 | Discharge: 2019-02-28 | Disposition: A | Discharge status: Home / Self Care | Payer: Self-pay | Attending: Emergency Medicine | Admitting: Emergency Medicine

## 2019-02-28 DIAGNOSIS — F1510 Other stimulant abuse, uncomplicated: Secondary | ICD-10-CM
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
## 2019-02-28 NOTE — Discharge Instructions (Addendum)
Please call one of the resources provided to you for detox.  Return to the ER for worsening symptoms, persistent vomiting, difficulty breathing or other concerns.

## 2019-03-06 ENCOUNTER — Encounter: Payer: Self-pay | Admitting: Emergency Medicine

## 2019-03-06 ENCOUNTER — Other Ambulatory Visit: Payer: Self-pay

## 2019-03-06 ENCOUNTER — Emergency Department
Admission: EM | Admit: 2019-03-06 | Discharge: 2019-03-06 | Disposition: A | Discharge status: Home / Self Care | Payer: Self-pay | Attending: Emergency Medicine | Admitting: Emergency Medicine

## 2019-03-06 DIAGNOSIS — L03113 Cellulitis of right upper limb: Secondary | ICD-10-CM

## 2019-03-06 DIAGNOSIS — F17210 Nicotine dependence, cigarettes, uncomplicated: Secondary | ICD-10-CM | POA: Insufficient documentation

## 2019-03-06 MED ORDER — SULFAMETHOXAZOLE-TRIMETHOPRIM 800-160 MG PO TABS
1.0000 | ORAL_TABLET | Freq: Once | ORAL | Status: AC
  Administered 2019-03-06: 1 via ORAL
  Filled 2019-03-06: qty 1

## 2019-03-06 MED ORDER — SULFAMETHOXAZOLE-TRIMETHOPRIM 800-160 MG PO TABS: 1.0000 | ORAL_TABLET | Freq: Two times a day (BID) | ORAL | 0 refills | Status: DC

## 2019-03-06 NOTE — ED Triage Notes (Signed)
Pt presents to ED via POV with c/o abscess to posterior R forearm x several days. Pt states pain feels like pressure, pt with area of redness noted at this time.

## 2019-03-06 NOTE — ED Provider Notes (Signed)
[facility] Emergency Department Provider Note  ____________________________________________  Time seen: Approximately 7:25 PM  I have reviewed the triage vital signs and the nursing notes.   HISTORY  Chief Complaint Abscess    HPI Jeffrey Kirk is a 23 y.o. male who presents the emergency department concern for possible abscess to the right forearm.  Patient reports that he has an erythematous and edematous painful lesion.  No purulent drainage.  Area is hard to palpation according to the patient.  No fevers or chills.  No other complaints at this time.         History reviewed. No pertinent past medical history.  There are no active problems to display for this patient.   History reviewed. No pertinent surgical history.  Prior to Admission medications   Medication Sig Start Date End Date Taking? Authorizing Provider  sulfamethoxazole-trimethoprim (BACTRIM DS) 800-160 MG tablet Take 1 tablet by mouth 2 (two) times daily. 03/06/19   Laymond Postle, Charline Bills, PA-C    Allergies Patient has no known allergies.  History reviewed. No pertinent family history.  Social History Social History   Tobacco Use  . Smoking status: Current Every Day Smoker  . Smokeless tobacco: Never Used  Substance Use Topics  . Alcohol use: Yes  . Drug use: Yes     Review of Systems  Constitutional: No fever/chills Eyes: No visual changes. No discharge ENT: No upper respiratory complaints. Cardiovascular: no chest pain. Respiratory: no cough. No SOB. Gastrointestinal: No abdominal pain.  No nausea, no vomiting.  Musculoskeletal: Negative for musculoskeletal pain. Skin: Possible abscess to the right forearm Neurological: Negative for headaches, focal weakness or numbness. 10-point ROS otherwise negative.  ____________________________________________   PHYSICAL EXAM:  VITAL SIGNS: ED Triage Vitals  Enc Vitals Group     BP 03/06/19 1749 (!) 139/97     Pulse  Rate 03/06/19 1749 88     Resp 03/06/19 1749 18     Temp 03/06/19 1749 97.8 F (36.6 C)     Temp Source 03/06/19 1749 Oral     SpO2 03/06/19 1749 99 %     Weight 03/06/19 1748 140 lb (63.5 kg)     Height 03/06/19 1748 5\' 11"  (1.803 m)     Head Circumference --      Peak Flow --      Pain Score 03/06/19 1746 7     Pain Loc --      Pain Edu? --      Excl. in [place]? --      Constitutional: Alert and oriented. Well appearing and in no acute distress. Eyes: Conjunctivae are normal. PERRL. EOMI. Head: Atraumatic. Neck: No stridor.    Cardiovascular: Normal rate, regular rhythm. Normal S1 and S2.  Good peripheral circulation. Respiratory: Normal respiratory effort without tachypnea or retractions. Lungs CTAB. Good air entry to the bases with no decreased or absent breath sounds. Musculoskeletal: Full range of motion to all extremities. No gross deformities appreciated. Neurologic:  Normal speech and language. No gross focal neurologic deficits are appreciated.  Skin:  Skin is warm, dry and intact. No rash noted.  Visualization of the right forearm reveals erythematous and edematous lesion.  Area is very tender to palpation.  Measures approximately 4 cm in diameter.  Central excoriation with no purulent drainage.  No fluctuance. Psychiatric: Mood and affect are normal. Speech and behavior are normal. Patient exhibits appropriate insight and judgement.   ____________________________________________   LABS (all labs ordered are listed, but only  abnormal results are displayed)  Labs Reviewed - No data to display ____________________________________________  EKG   ____________________________________________  RADIOLOGY   No results found.  ____________________________________________    PROCEDURES  Procedure(s) performed:    Procedures    Medications  sulfamethoxazole-trimethoprim (BACTRIM DS) 800-160 MG per tablet 1 tablet (has no administration in time range)      ____________________________________________   INITIAL IMPRESSION / ASSESSMENT AND PLAN / ED COURSE  Pertinent labs & imaging results that were available during my care of the patient were reviewed by me and considered in my medical decision making (see chart for details).  Review of the [place] CSRS was performed in accordance of the [facility] prior to dispensing any controlled drugs.           Patient's diagnosis is consistent with cellulitis of the right forearm.  Patient presented to the emergency department with a possible abscess to the right forearm.  Patient has multiple excoriations consistent with picking.  Patient does have a history of meth use (with the patient having been seen 6 days ago for methamphetamine detox) which would explain picking.  One such area appears to have become infected.  No indication of underlying abscess requiring incision and drainage.  No indication for labs or imaging at this time.  Patient will be placed on Bactrim, with first dose being administered emergency department. Patient will be discharged home with prescriptions for Bactrim. Patient is to follow up with primary care as needed or otherwise directed. Patient is given ED precautions to return to the ED for any worsening or new symptoms.     ____________________________________________  FINAL CLINICAL IMPRESSION(S) / ED DIAGNOSES  Final diagnoses:  Cellulitis of right upper extremity      NEW MEDICATIONS STARTED DURING THIS VISIT:  ED Discharge Orders         Ordered    sulfamethoxazole-trimethoprim (BACTRIM DS) 800-160 MG tablet  2 times daily     03/06/19 1936              This chart was dictated using voice recognition software/Dragon. Despite best efforts to proofread, errors can occur which can change the meaning. Any change was purely unintentional.    Racheal PatchesCuthriell, Elexia Friedt D, PA-C 03/06/19 1936    Arnaldo NatalMalinda, Paul F, MD 03/06/19 [phone]

## 2019-04-11 ENCOUNTER — Emergency Department: Payer: Self-pay

## 2019-04-11 ENCOUNTER — Emergency Department
Admission: EM | Admit: 2019-04-11 | Discharge: 2019-04-11 | Disposition: A | Discharge status: Home / Self Care | Payer: Self-pay | Attending: Emergency Medicine | Admitting: Emergency Medicine

## 2019-04-11 ENCOUNTER — Encounter: Payer: Self-pay | Admitting: Emergency Medicine

## 2019-04-11 ENCOUNTER — Other Ambulatory Visit: Payer: Self-pay

## 2019-04-11 DIAGNOSIS — F17200 Nicotine dependence, unspecified, uncomplicated: Secondary | ICD-10-CM | POA: Insufficient documentation

## 2019-04-11 DIAGNOSIS — R0789 Other chest pain: Secondary | ICD-10-CM

## 2019-04-11 DIAGNOSIS — R0602 Shortness of breath: Secondary | ICD-10-CM | POA: Insufficient documentation

## 2019-04-11 DIAGNOSIS — R1011 Right upper quadrant pain: Secondary | ICD-10-CM | POA: Insufficient documentation

## 2019-04-11 LAB — CBC
HCT: 41.4 % (ref 39.0–52.0)
Hemoglobin: 14.5 g/dL (ref 13.0–17.0)
MCH: 31.3 pg (ref 26.0–34.0)
MCHC: 35 g/dL (ref 30.0–36.0)
MCV: 89.2 fL (ref 80.0–100.0)
Platelets: 302 10*3/uL (ref 150–400)
RBC: 4.64 MIL/uL (ref 4.22–5.81)
RDW: 12.1 % (ref 11.5–15.5)
WBC: 13.2 10*3/uL — ABNORMAL HIGH (ref 4.0–10.5)
nRBC: 0 % (ref 0.0–0.2)

## 2019-04-11 LAB — LIPASE, BLOOD: Lipase: 28 U/L (ref 11–51)

## 2019-04-11 LAB — TROPONIN I (HIGH SENSITIVITY): Troponin I (High Sensitivity): 10 ng/L (ref <18)

## 2019-04-11 LAB — BASIC METABOLIC PANEL
Anion gap: 8 (ref 5–15)
BUN: 13 mg/dL (ref 6–20)
CO2: 26 mmol/L (ref 22–32)
Calcium: 9.4 mg/dL (ref 8.9–10.3)
Chloride: 102 mmol/L (ref 98–111)
Creatinine, Ser: 0.74 mg/dL (ref 0.61–1.24)
GFR calc Af Amer: >60 mL/min (ref >60)
GFR calc non Af Amer: >60 mL/min (ref >60)
Glucose, Bld: 126 mg/dL — ABNORMAL HIGH (ref 70–99)
Potassium: 3.6 mmol/L (ref 3.5–5.1)
Sodium: 136 mmol/L (ref 135–145)

## 2019-04-11 LAB — HEPATIC FUNCTION PANEL
ALT: 17 U/L (ref 0–44)
AST: 15 U/L (ref 15–41)
Albumin: 3.8 g/dL (ref 3.5–5.0)
Alkaline Phosphatase: 58 U/L (ref 38–126)
Bilirubin, Direct: 0.1 mg/dL (ref 0.0–0.2)
Indirect Bilirubin: 0.6 mg/dL (ref 0.3–0.9)
Total Bilirubin: 0.7 mg/dL (ref 0.3–1.2)
Total Protein: 7 g/dL (ref 6.5–8.1)

## 2019-04-11 MED ORDER — SODIUM CHLORIDE 0.9% FLUSH: 3.0000 mL | Freq: Once | INTRAVENOUS | Status: DC

## 2019-04-11 MED ORDER — KETOROLAC TROMETHAMINE 60 MG/2ML IM SOLN
60.0000 mg | Freq: Once | INTRAMUSCULAR | Status: AC
  Administered 2019-04-11: 60 mg via INTRAMUSCULAR
  Filled 2019-04-11: qty 2

## 2019-04-11 MED ORDER — FAMOTIDINE 20 MG PO TABS: 20.0000 mg | ORAL_TABLET | Freq: Every day | ORAL | 1 refills | Status: AC

## 2019-04-11 NOTE — ED Notes (Signed)
Pt admits to meth use 4 days ago.

## 2019-04-11 NOTE — ED Provider Notes (Signed)
[facility] Emergency Department Provider Note   ____________________________________________   I have reviewed the triage vital signs and the nursing notes.   HISTORY  Chief Complaint Chest Pain and Shortness of Breath   History limited by: Not Limited   HPI Jeffrey Kirk is a 23 y.o. male who presents to the emergency department today because of concern for right sided pain. It is located in the upper abdomen, lower chest area. Started two days ago. Has been severe. Worse when he takes a deep breath. Has had some occasional cough. Denies any fevers. No nausea or vomiting.   Records reviewed. Per medical record review patient has a history of ER visits for back pain, drug use.  History reviewed. No pertinent past medical history.  There are no active problems to display for this patient.   History reviewed. No pertinent surgical history.  Prior to Admission medications   Medication Sig Start Date End Date Taking? Authorizing Provider  sulfamethoxazole-trimethoprim (BACTRIM DS) 800-160 MG tablet Take 1 tablet by mouth 2 (two) times daily. 03/06/19   Cuthriell, Charline Bills, PA-C    Allergies Patient has no known allergies.  No family history on file.  Social History Social History   Tobacco Use  . Smoking status: Current Every Day Smoker  . Smokeless tobacco: Never Used  Substance Use Topics  . Alcohol use: Yes  . Drug use: Yes    Review of Systems Constitutional: No fever/chills Eyes: No visual changes. ENT: No sore throat. Cardiovascular: Denies chest pain. Respiratory: Positive for shortness of breath. Gastrointestinal: Positive for right upper quadrant pain. Genitourinary: Negative for dysuria. Musculoskeletal: Negative for back pain. Skin: Negative for rash. Neurological: Negative for headaches, focal weakness or numbness.  ____________________________________________   PHYSICAL EXAM:  VITAL SIGNS: ED Triage Vitals  Enc  Vitals Group     BP 04/11/19 1444 (!) 114/54     Pulse Rate 04/11/19 1444 84     Resp 04/11/19 1444 18     Temp 04/11/19 1444 98.8 F (37.1 C)     Temp Source 04/11/19 1444 Oral     SpO2 04/11/19 1444 100 %     Weight 04/11/19 1444 140 lb (63.5 kg)     Height 04/11/19 1444 5\' 11"  (1.803 m)     Head Circumference --      Peak Flow --      Pain Score 04/11/19 1505 8   Constitutional: Alert and oriented.  Eyes: Conjunctivae are normal.  ENT      Head: Normocephalic and atraumatic.      Nose: No congestion/rhinnorhea.      Mouth/Throat: Mucous membranes are moist.      Neck: No stridor. Hematological/Lymphatic/Immunilogical: No cervical lymphadenopathy. Cardiovascular: Normal rate, regular rhythm.  No murmurs, rubs, or gallops.  Respiratory: Normal respiratory effort without tachypnea nor retractions. Breath sounds are clear and equal bilaterally. No wheezes/rales/rhonchi. Gastrointestinal: Soft and tender to palpation in the right upper quadrant.  Genitourinary: Deferred Musculoskeletal: Normal range of motion in all extremities. No lower extremity edema. Neurologic:  Normal speech and language. No gross focal neurologic deficits are appreciated.  Skin:  Skin is warm, dry and intact. No rash noted. Psychiatric: Mood and affect are normal. Speech and behavior are normal. Patient exhibits appropriate insight and judgment.  ____________________________________________    LABS (pertinent positives/negatives)  Trop hs 10 BMP wnl except glu 126 CBC wbc 13.2, hgb 14.5, plt 302  ____________________________________________   EKG  I, Nance Pear, attending physician,  personally viewed and interpreted this EKG  EKG Time: 1457 Rate: 83 Rhythm: normal sinus rhythm Axis: normal Intervals: qtc 404 QRS: incomplete RBBB ST changes: no st elevation Impression: abnormal ekg   ____________________________________________    RADIOLOGY  CXR No active cardiopulmonary  disease  US RUQ No gallstones or cholecystitis.  ____________________________________________   PROCEDURES  Procedures  ____________________________________________   INITIAL IMPRESSION / ASSESSMENT AND PLAN / ED COURSE  Pertinent labs & imaging results that were available during my care of the patient were reviewed by me and considered in my medical decision making (see chart for details).  Patient presented to the emergency department today because of concerns for right upper quadrant/right lower chest pain.  Patient was tender in the right upper quadrant.  Chest x-ray without pneumonia or pneumothorax.  Did obtain a right upper quadrant ultrasound which was negative for gallstones.  Patient's blood work did show a mild leukocytosis.  This point do wonder if patient has ulcerative disease or potential inflammation.  Discussed this with the patient.  Will plan on discharging home.  ____________________________________________   FINAL CLINICAL IMPRESSION(S) / ED DIAGNOSES  Final diagnoses:  RUQ pain  Atypical chest pain     Note: This dictation was prepared with Dragon dictation. Any transcriptional errors that result from this process are unintentional     Phineas SemenGoodman, Tymon Nemetz, MD 04/11/19 [phone]

## 2019-04-11 NOTE — Discharge Instructions (Addendum)
Please seek medical attention for any high fevers, chest pain, shortness of breath, change in behavior, persistent vomiting, bloody stool or any other new or concerning symptoms.  

## 2019-04-11 NOTE — ED Triage Notes (Signed)
Pt here with c/o bilateral rib pain and shob that began 2 days ago, denies injury to area, denies fever, does have slight cough. NAD.

## 2020-02-18 ENCOUNTER — Telehealth: Payer: Self-pay | Admitting: General Practice

## 2020-02-18 NOTE — Telephone Encounter (Signed)
Individual has been contacted 3+ times regarding ED referral and has been given information regarding how to become a pt. No further attempts to contact individual will be made. 

## 2020-03-23 IMAGING — CR CHEST - 2 VIEW
1 series · 2 of 2 positions shown · non-contrast
Comparison: 08/08/2016

CLINICAL DATA: Rib pain and shortness of breath

EXAM:
CHEST - 2 VIEW

[Series 1: dg chest 2 view · 0.14mm/px · 2 of 2 slices shown]
[im 1/2]
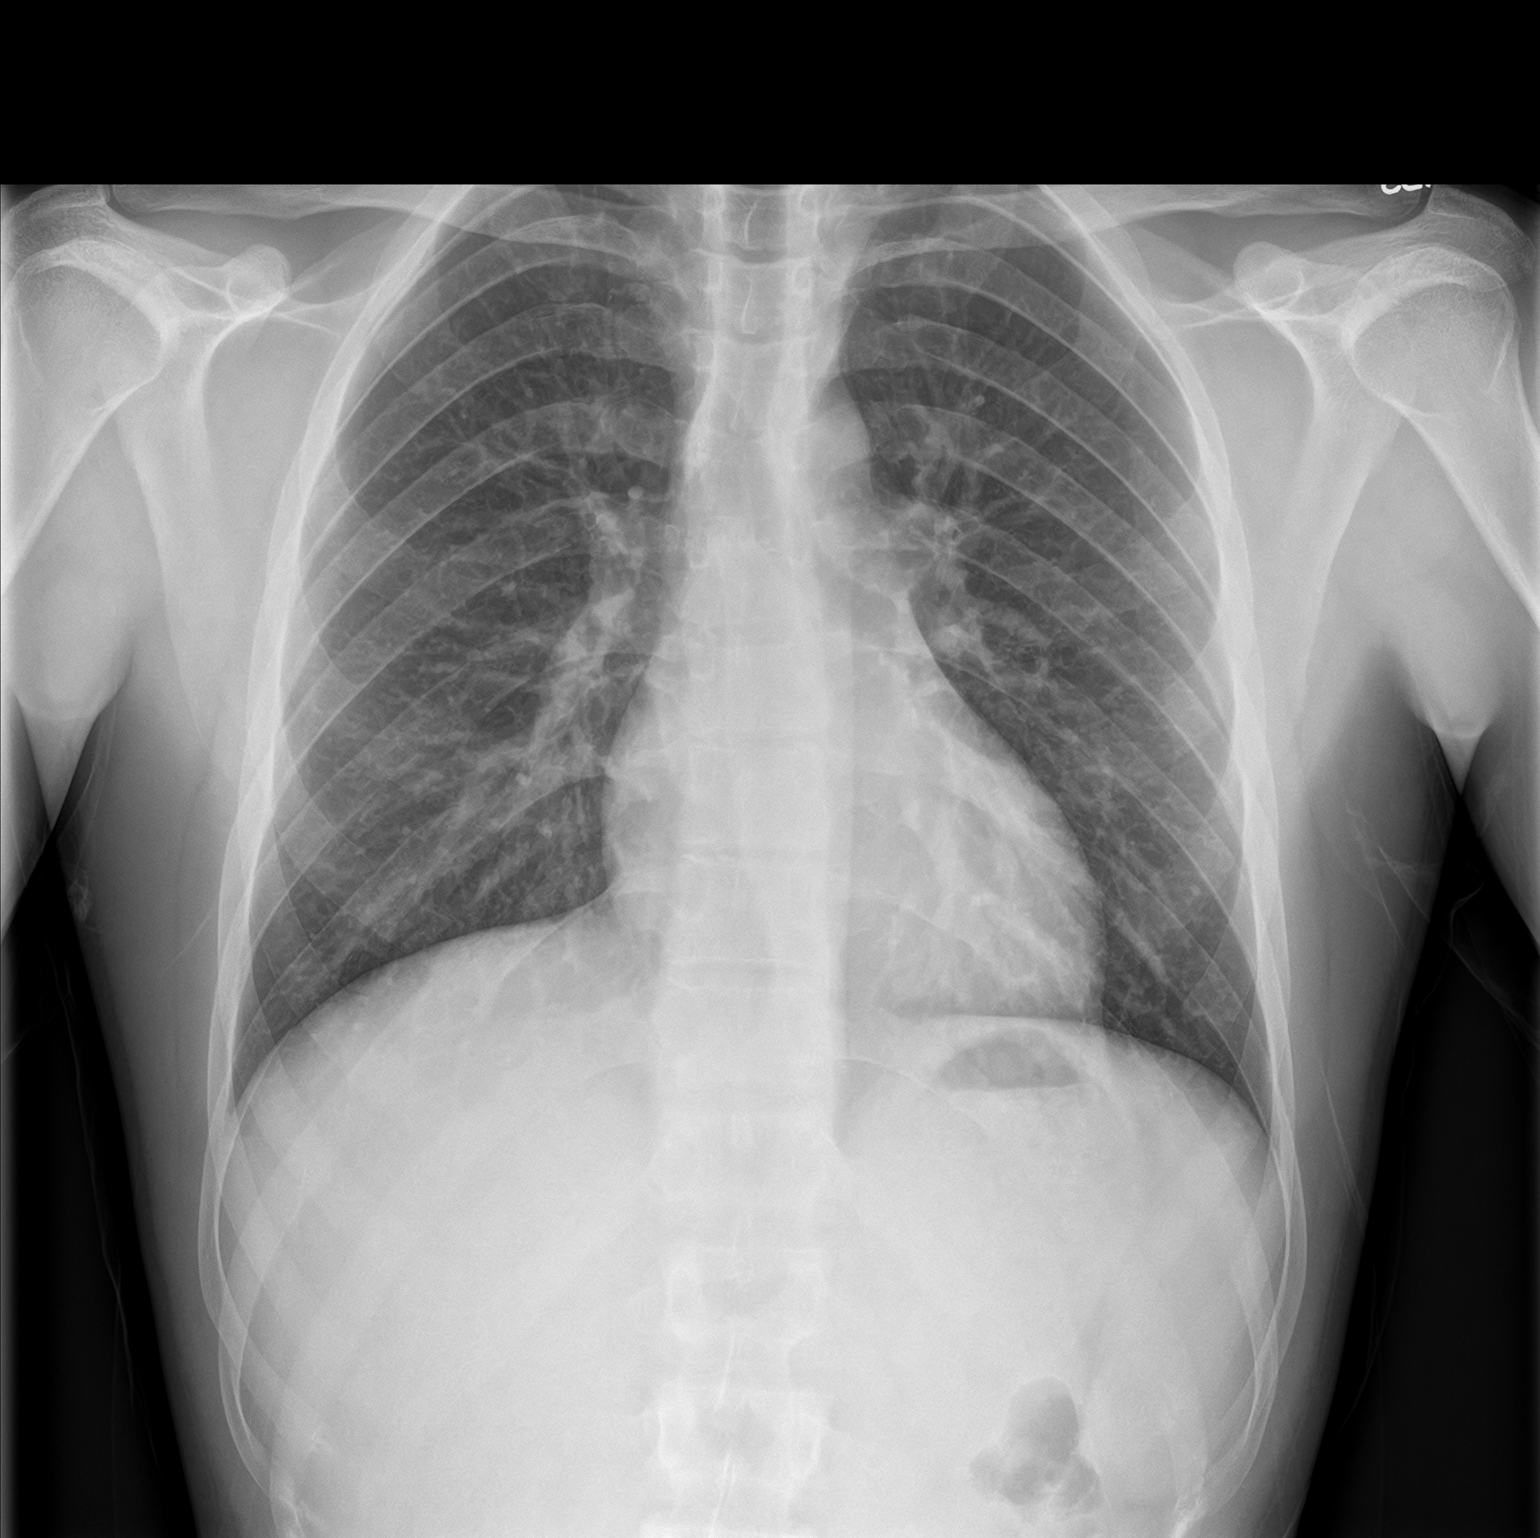
[im 2/2]
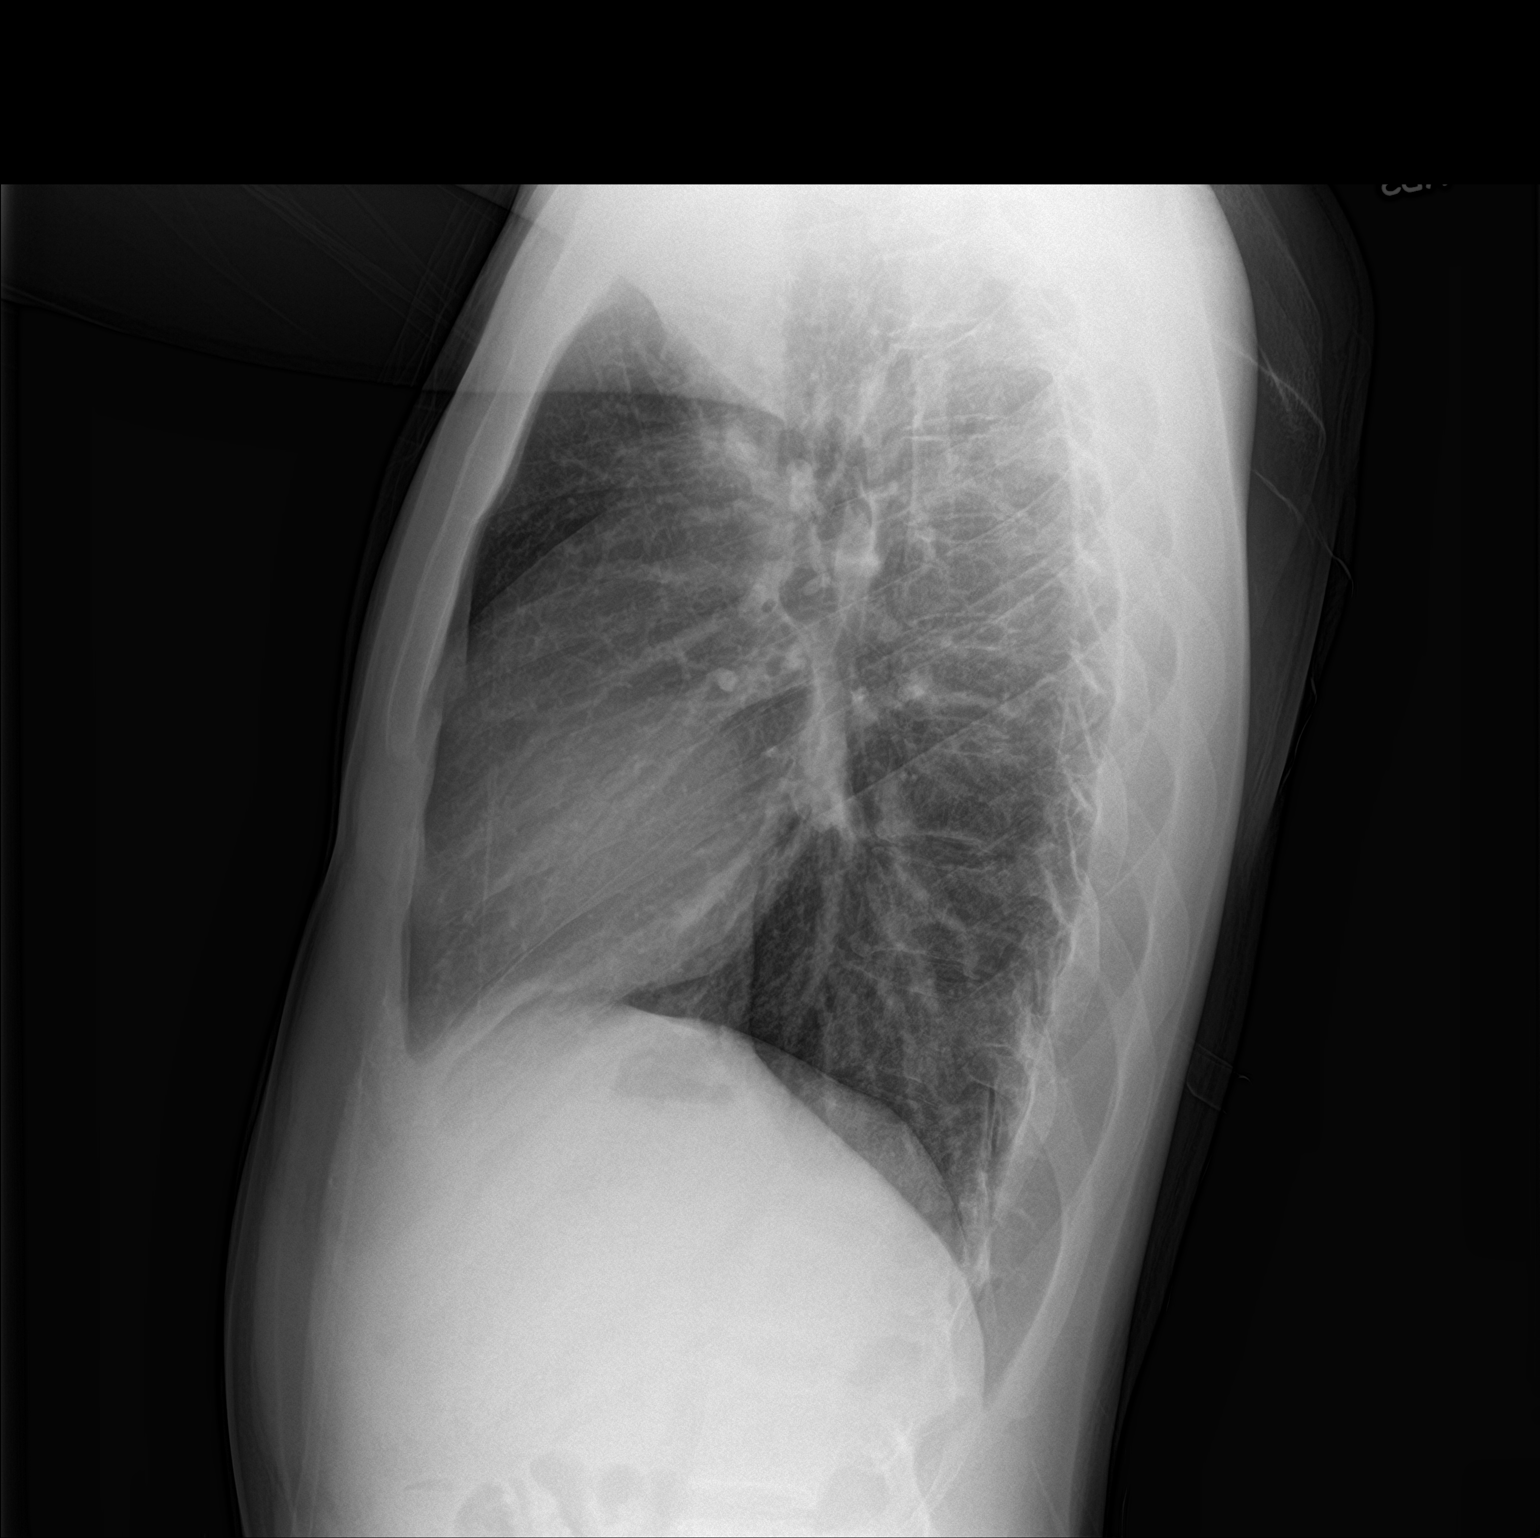

[2 of 2 positions shown; findings below may reference images not displayed]

FINDINGS: The heart size and mediastinal contours are within normal limits.
Both lungs are clear. The visualized skeletal structures are
unremarkable.
IMPRESSION: No active cardiopulmonary disease.

## 2020-10-23 ENCOUNTER — Encounter: Payer: Self-pay | Admitting: Emergency Medicine

## 2020-10-23 ENCOUNTER — Emergency Department
Admission: EM | Admit: 2020-10-23 | Discharge: 2020-10-23 | Disposition: A | Discharge status: Home / Self Care | Payer: Self-pay | Attending: Emergency Medicine | Admitting: Emergency Medicine

## 2020-10-23 ENCOUNTER — Other Ambulatory Visit: Payer: Self-pay

## 2020-10-23 DIAGNOSIS — L03115 Cellulitis of right lower limb: Secondary | ICD-10-CM | POA: Insufficient documentation

## 2020-10-23 DIAGNOSIS — F17200 Nicotine dependence, unspecified, uncomplicated: Secondary | ICD-10-CM | POA: Insufficient documentation

## 2020-10-23 DIAGNOSIS — L03119 Cellulitis of unspecified part of limb: Secondary | ICD-10-CM

## 2020-10-23 MED ORDER — SULFAMETHOXAZOLE-TRIMETHOPRIM 400-80 MG PO TABS: 1.0000 | ORAL_TABLET | Freq: Two times a day (BID) | ORAL | 0 refills | Status: DC

## 2020-10-23 MED ORDER — DOXYCYCLINE HYCLATE 100 MG PO TABS: 100.0000 mg | ORAL_TABLET | Freq: Two times a day (BID) | ORAL | 0 refills | Status: AC

## 2020-10-23 NOTE — ED Notes (Signed)
Patient declined discharge vital signs. 

## 2020-10-23 NOTE — Discharge Instructions (Addendum)
Take Bactrim twice daily for the next seven days.  Take Doxycycline twice daily for the next seven days.  Return with new or worsening symptoms.

## 2020-10-23 NOTE — ED Triage Notes (Signed)
Pt to ED via POV with c/o R foot pain that started yesterday evening, pt states swelling to ball of his feet. 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 Pt states pain increases with walking.   Pt denies know injury at this time. Pt ambulatory to triage at this time.

## 2020-10-23 NOTE — ED Provider Notes (Signed)
[facility]-EMERGENCY DEPARTMENT  ____________________________________________  Time seen: Approximately 4:03 PM  I have reviewed the triage vital signs and the nursing notes.   HISTORY  Chief Complaint Foot Pain   Historian Patient    HPI Jeffrey Kirk is a 25 y.o. male presents to the emergency department with right foot pain for the past 2 days.  Patient has noticed a small region of erythema between his right second and third toes and a small region of streaking along the midfoot.  Patient denies fever and chills.  Denies a history of IV drug use.  Denies a history of gout and states he has never had a MRSA infection in the past.  Review of patient's history shows that patient has had an abscess along the right upper extremity in the past and has a history of methamphetamine use.  He states that right foot becomes more painful with ambulation is improved with rest.  No other alleviating measures have been attempted.   History reviewed. No pertinent past medical history.   Immunizations up to date:  Yes.     History reviewed. No pertinent past medical history.  There are no problems to display for this patient.   History reviewed. No pertinent surgical history.  Prior to Admission medications   Medication Sig Start Date End Date Taking? Authorizing Provider  doxycycline (VIBRA-TABS) 100 MG tablet Take 1 tablet (100 mg total) by mouth 2 (two) times daily for 7 days. 10/23/20 10/30/20 Yes Pia Mau M, PA-C  sulfamethoxazole-trimethoprim (BACTRIM) 400-80 MG tablet Take 1 tablet by mouth 2 (two) times daily. 10/23/20  Yes Pia Mau M, PA-C  famotidine (PEPCID) 20 MG tablet Take 1 tablet (20 mg total) by mouth daily. 04/11/19 04/10/20  Phineas Semen, MD    Allergies Patient has no known allergies.  History reviewed. No pertinent family history.  Social History Social History   Tobacco Use  . Smoking status: Current Every Day Smoker  . Smokeless tobacco: Never Used   Substance Use Topics  . Alcohol use: Yes  . Drug use: Yes     Review of Systems  Constitutional: No fever/chills Eyes:  No discharge ENT: No upper respiratory complaints. Respiratory: no cough. No SOB/ use of accessory muscles to breath Gastrointestinal:   No nausea, no vomiting.  No diarrhea.  No constipation. Musculoskeletal: Negative for musculoskeletal pain. Skin: Patient has erythema of the right foot.     ____________________________________________   PHYSICAL EXAM:  VITAL SIGNS: ED Triage Vitals  Enc Vitals Group     BP 10/23/20 1451 (!) 141/89     Pulse Rate 10/23/20 1451 93     Resp 10/23/20 1451 19     Temp 10/23/20 1451 97.7 F (36.5 C)     Temp Source 10/23/20 1451 Oral     SpO2 10/23/20 1451 98 %     Weight 10/23/20 1451 160 lb (72.6 kg)     Height 10/23/20 1451 5\' 11"  (1.803 m)     Head Circumference --      Peak Flow --      Pain Score 10/23/20 1456 4     Pain Loc --      Pain Edu? --      Excl. in GC? --      Constitutional: Alert and oriented. Well appearing and in no acute distress. Eyes: Conjunctivae are normal. PERRL. EOMI. Head: Atraumatic. ENT:      Nose: No congestion/rhinnorhea.      Mouth/Throat: Mucous membranes are moist.  Neck: No stridor.  No cervical spine tenderness to palpation. Cardiovascular: Normal rate, regular rhythm. Normal S1 and S2.  Good peripheral circulation. Respiratory: Normal respiratory effort without tachypnea or retractions. Lungs CTAB. Good air entry to the bases with no decreased or absent breath sounds Gastrointestinal: Bowel sounds x 4 quadrants. Soft and nontender to palpation. No guarding or rigidity. No distention. Musculoskeletal: Full range of motion to all extremities. No obvious deformities noted Neurologic:  Normal for age. No gross focal neurologic deficits are appreciated.  Skin: Patient has small region of erythema between the right first and second toes and a small region of streaking along the  right midfoot.  Region of cellulitis is approximately 1 and half centimeter by 1 and half centimeter. Psychiatric: Mood and affect are normal for age. Speech and behavior are normal.   ____________________________________________   LABS (all labs ordered are listed, but only abnormal results are displayed)  Labs Reviewed - No data to display ____________________________________________  EKG   ____________________________________________  RADIOLOGY   No results found.  ____________________________________________    PROCEDURES  Procedure(s) performed:     Procedures     Medications - No data to display   ____________________________________________   INITIAL IMPRESSION / ASSESSMENT AND PLAN / ED COURSE  Pertinent labs & imaging results that were available during my care of the patient were reviewed by me and considered in my medical decision making (see chart for details).      Assessment and Plan: Foot cellulitis 25 year old male presents to the emergency department with a 1 and half centimeter by 1 and half centimeter region of cellulitis between the right first and second toes with a small region of streaking along the midfoot.  Vital signs are reassuring at triage.  On physical exam, patient was alert, active and nontoxic-appearing.  Will start patient on Bactrim and doxycycline with strict return precautions to return with new or worsening symptoms.     ____________________________________________  FINAL CLINICAL IMPRESSION(S) / ED DIAGNOSES  Final diagnoses:  Cellulitis of foot      NEW MEDICATIONS STARTED DURING THIS VISIT:  ED Discharge Orders         Ordered    sulfamethoxazole-trimethoprim (BACTRIM) 400-80 MG tablet  2 times daily        10/23/20 1558    doxycycline (VIBRA-TABS) 100 MG tablet  2 times daily        10/23/20 1558              This chart was dictated using voice recognition software/Dragon. Despite best efforts  to proofread, errors can occur which can change the meaning. Any change was purely unintentional.     Orvil Feil, PA-C 10/23/20 1609    Delton Prairie, MD 10/23/20 [phone]

## 2024-01-11 ENCOUNTER — Ambulatory Visit: Payer: Self-pay | Admitting: Family Medicine

## 2024-01-11 DIAGNOSIS — A599 Trichomoniasis, unspecified: Secondary | ICD-10-CM

## 2024-01-11 DIAGNOSIS — Z202 Contact with and (suspected) exposure to infections with a predominantly sexual mode of transmission: Secondary | ICD-10-CM

## 2024-01-11 MED ORDER — METRONIDAZOLE 500 MG PO TABS: 2000.0000 mg | ORAL_TABLET | Freq: Once | ORAL | Status: DC

## 2024-01-11 MED ORDER — METRONIDAZOLE 500 MG PO TABS: 500.0000 mg | ORAL_TABLET | Freq: Two times a day (BID) | ORAL | Status: DC

## 2024-01-11 MED ORDER — METRONIDAZOLE 500 MG PO TABS: 2000.0000 mg | ORAL_TABLET | Freq: Once | ORAL | Status: AC

## 2024-01-11 MED ORDER — METRONIDAZOLE 500 MG PO TABS: 500.0000 mg | ORAL_TABLET | Freq: Once | ORAL | 0 refills | Status: DC

## 2024-01-11 NOTE — Progress Notes (Signed)
 PT is here as contact to trich, declined provider exam.  Wants treatment only.  The patient was dispensed metronidazole  #4  today. I provided counseling today regarding the medication. We discussed the medication, the side effects and when to call clinic. Patient given the opportunity to ask questions. Questions answered.  Condoms declined. Caren Channel, RN

## 2024-01-11 NOTE — Progress Notes (Signed)
 Declined to see provider. Contact to trich. Meds given to patient by RN  Earleen Glazier FNP-C

## 2024-02-19 ENCOUNTER — Emergency Department
Admission: EM | Admit: 2024-02-19 | Discharge: 2024-02-19 | Disposition: A | Discharge status: Home / Self Care | Payer: Self-pay | Attending: Emergency Medicine | Admitting: Emergency Medicine

## 2024-02-19 ENCOUNTER — Other Ambulatory Visit: Payer: Self-pay

## 2024-02-19 DIAGNOSIS — H1032 Unspecified acute conjunctivitis, left eye: Secondary | ICD-10-CM | POA: Insufficient documentation

## 2024-02-19 MED ORDER — ERYTHROMYCIN 5 MG/GM OP OINT: 1.0000 | TOPICAL_OINTMENT | Freq: Four times a day (QID) | OPHTHALMIC | 0 refills | Status: DC

## 2024-02-19 NOTE — ED Provider Notes (Signed)
 [facility] Provider Note    Event Date/Time   First MD Initiated Contact with Patient 02/19/24 0701     (approximate)   History   Chief Complaint Eye Drainage   HPI  Jeffrey Kirk is a 28 y.o. male with no significant past medical history who presents to the ED complaining of eye drainage.  Patient reports that for the past 5 days he has had thick and crusty drainage from his left eye, especially when he wakes up in the morning.  He states that his eye has felt irritated and uncomfortable, has also noticed some redness to the eye.  He denies any blurriness in his vision, has not had any foreign body sensation.  He does not wear contact lenses.  He denies any fevers, has had some recent runny nose and congestion but is not aware of any specific sick contacts.     Physical Exam   Triage Vital Signs: ED Triage Vitals  Encounter Vitals Group     BP 02/19/24 0636 123/86     Girls Systolic BP Percentile --      Girls Diastolic BP Percentile --      Boys Systolic BP Percentile --      Boys Diastolic BP Percentile --      Pulse Rate 02/19/24 0639 60     Resp 02/19/24 0645 19     Temp 02/19/24 0636 (!) 97.4 F (36.3 C)     Temp Source 02/19/24 0636 Oral     SpO2 02/19/24 0639 100 %     Weight 02/19/24 0636 150 lb (68 kg)     Height 02/19/24 0636 5' 11 (1.803 m)     Head Circumference --      Peak Flow --      Pain Score --      Pain Loc --      Pain Education --      Exclude from Growth Chart --     Most recent vital signs: Vitals:   02/19/24 0639 02/19/24 0645  BP:    Pulse: 60   Resp:  19  Temp:    SpO2: 100%     Constitutional: Alert and oriented. Eyes: Conjunctival injection on the left with small amount of yellow drainage.  Pupils equal, round, and reactive to light bilaterally.  Extraocular movements intact.  No foreign bodies noted. Head: Atraumatic. Nose: No congestion/rhinnorhea. Mouth/Throat: Mucous membranes are moist.   Cardiovascular: Normal rate, regular rhythm. Grossly normal heart sounds.  2+ radial pulses bilaterally. Respiratory: Normal respiratory effort.  No retractions. Lungs CTAB. Gastrointestinal: Soft and nontender. No distention. Musculoskeletal: No lower extremity tenderness nor edema.  Neurologic:  Normal speech and language. No gross focal neurologic deficits are appreciated.    ED Results / Procedures / Treatments   Labs (all labs ordered are listed, but only abnormal results are displayed) Labs Reviewed - No data to display   PROCEDURES:  Critical Care performed: No  Procedures   MEDICATIONS ORDERED IN ED: Medications - No data to display   IMPRESSION / MDM / ASSESSMENT AND PLAN / ED COURSE  I reviewed the triage vital signs and the nursing notes.                              28 y.o. male with no significant past medical history presents to the ED with redness and drainage from his left eye for the past  5 days.  Patient's presentation is most consistent with acute, uncomplicated illness.  Differential diagnosis includes, but is not limited to, viral conjunctivitis, bacterial conjunctivitis, foreign body, cellulitis.  Patient nontoxic-appearing and in no acute distress, vital signs are unremarkable.  Exam of the eye is consistent with a conjunctivitis and patient appropriate for outpatient management with erythromycin ointment.  He was counseled to follow-up with ophthalmology as needed and to return to the ED for new or worsening symptoms, patient agrees with plan.      FINAL CLINICAL IMPRESSION(S) / ED DIAGNOSES   Final diagnoses:  Acute conjunctivitis of left eye, unspecified acute conjunctivitis type     Rx / DC Orders   ED Discharge Orders          Ordered    erythromycin ophthalmic ointment  4 times daily        02/19/24 9277             Note:  This document was prepared using Dragon voice recognition software and may include unintentional  dictation errors.   Willo Dunnings, MD 02/19/24 [phone]

## 2024-05-06 ENCOUNTER — Emergency Department
Admission: EM | Admit: 2024-05-06 | Discharge: 2024-05-06 | Disposition: A | Discharge status: Home / Self Care | Payer: Self-pay | Attending: Emergency Medicine | Admitting: Emergency Medicine

## 2024-05-06 ENCOUNTER — Other Ambulatory Visit: Payer: Self-pay

## 2024-05-06 DIAGNOSIS — H1012 Acute atopic conjunctivitis, left eye: Secondary | ICD-10-CM | POA: Insufficient documentation

## 2024-05-06 MED ORDER — OLOPATADINE HCL 0.2 % OP SOLN: OPHTHALMIC | 0 refills | Status: AC

## 2024-05-06 NOTE — Discharge Instructions (Signed)
 Call make an appointment with [facility] if any continued problems. Use the eyedrops as directed daily.  These were sent to your pharmacy.

## 2024-05-06 NOTE — ED Provider Notes (Signed)
 [facility] Provider Note    Event Date/Time   First MD Initiated Contact with Patient 05/06/24 [phone]     (approximate)   History   No chief complaint on file.   HPI  Jeffrey Kirk is a 28 y.o. male   presents to the ED with complaint of left eye redness and discomfort off-and-on since June.  Patient denies any recent injury and states he is not having any difficulty with his vision.  He states he has been using warm compresses to the area.  Several months ago he was diagnosed with conjunctivitis and has been using the antibiotic medication without any relief.      Physical Exam   Triage Vital Signs: ED Triage Vitals  Encounter Vitals Group     BP 05/06/24 0922 (!) 130/95     Girls Systolic BP Percentile --      Girls Diastolic BP Percentile --      Boys Systolic BP Percentile --      Boys Diastolic BP Percentile --      Pulse Rate 05/06/24 0922 82     Resp 05/06/24 0922 16     Temp 05/06/24 0922 97.6 F (36.4 C)     Temp Source 05/06/24 0922 Oral     SpO2 05/06/24 0922 97 %     Weight 05/06/24 0923 149 lb 14.6 oz (68 kg)     Height --      Head Circumference --      Peak Flow --      Pain Score 05/06/24 0923 0     Pain Loc --      Pain Education --      Exclude from Growth Chart --     Most recent vital signs: Vitals:   05/06/24 0922 05/06/24 1042  BP: (!) 130/95   Pulse: 82   Resp: 16 16  Temp: 97.6 F (36.4 C)   SpO2: 97% 99%     General: Awake, no distress.  Alert, cooperative. CV:  Good peripheral perfusion.  Resp:  Normal effort.  Abd:  No distention.  Other:  Conjunctival are clear bilaterally.  PERRLA, EOMI's.  No discharge at present.  No periorbital edema or discoloration present.  No stye or irritation noted to the lid.   ED Results / Procedures / Treatments   Labs (all labs ordered are listed, but only abnormal results are displayed) Labs Reviewed - No data to display     PROCEDURES:  Critical Care  performed:   Procedures   MEDICATIONS ORDERED IN ED: Medications - No data to display   IMPRESSION / MDM / ASSESSMENT AND PLAN / ED COURSE  I reviewed the triage vital signs and the nursing notes.   Differential diagnosis includes, but is not limited to, bacterial conjunctivitis, viral conjunctivitis, allergic conjunctivitis, foreign body, stye.  28 year old male presents to the ED with complaint of left eye irritation and drainage intermittently since June.  Patient initially was seen and treated with erythromycin  which she states initially helped.  Visual acuity noted in ED.  A prescription for Pataday  was sent to the pharmacy for patient begin using.  He is also strongly encouraged to follow-up with [facility] for further evaluation if he continues to have problems with his left eye.      Patient's presentation is most consistent with acute, uncomplicated illness.  FINAL CLINICAL IMPRESSION(S) / ED DIAGNOSES   Final diagnoses:  Allergic conjunctivitis of left eye  Rx / DC Orders   ED Discharge Orders          Ordered    Olopatadine  HCl 0.2 % SOLN        05/06/24 1035             Note:  This document was prepared using Dragon voice recognition software and may include unintentional dictation errors.   Jeffrey Shona CROME, PA-C 05/06/24 1201    Jeffrey Charleston, MD 05/06/24 [phone]

## 2024-05-06 NOTE — ED Triage Notes (Signed)
 C/O left eye redness, discomfort .Jeffrey Kirk Ongoing since June. NOticed a 'pocket' to lateral corner of eye x 2 weeks. Has been putting warm compresses on area and area draining white.
